# Patient Record
Sex: Male | Born: 1945 | Race: White | Hispanic: No | Marital: Married | Smoking: Former smoker
Health system: Southern US, Community
[De-identification: ages and names within clinical notes are randomized; demographics above are authoritative.]

## PROBLEM LIST (undated history)

## (undated) DIAGNOSIS — K449 Diaphragmatic hernia without obstruction or gangrene: Secondary | ICD-10-CM

## (undated) DIAGNOSIS — K219 Gastro-esophageal reflux disease without esophagitis: Secondary | ICD-10-CM

## (undated) DIAGNOSIS — J189 Pneumonia, unspecified organism: Secondary | ICD-10-CM

## (undated) DIAGNOSIS — K648 Other hemorrhoids: Secondary | ICD-10-CM

## (undated) DIAGNOSIS — E785 Hyperlipidemia, unspecified: Secondary | ICD-10-CM

## (undated) DIAGNOSIS — K5790 Diverticulosis of intestine, part unspecified, without perforation or abscess without bleeding: Secondary | ICD-10-CM

## (undated) DIAGNOSIS — J309 Allergic rhinitis, unspecified: Secondary | ICD-10-CM

## (undated) DIAGNOSIS — K635 Polyp of colon: Secondary | ICD-10-CM

## (undated) HISTORY — DX: Gastro-esophageal reflux disease without esophagitis: K21.9

## (undated) HISTORY — DX: Allergic rhinitis, unspecified: J30.9

## (undated) HISTORY — DX: Other hemorrhoids: K64.8

## (undated) HISTORY — DX: Hyperlipidemia, unspecified: E78.5

## (undated) HISTORY — PX: ANTERIOR CRUCIATE LIGAMENT REPAIR: SHX115

## (undated) HISTORY — DX: Diaphragmatic hernia without obstruction or gangrene: K44.9

## (undated) HISTORY — DX: Diverticulosis of intestine, part unspecified, without perforation or abscess without bleeding: K57.90

## (undated) HISTORY — DX: Pneumonia, unspecified organism: J18.9

## (undated) HISTORY — DX: Polyp of colon: K63.5

---

## 2000-03-18 ENCOUNTER — Encounter: Payer: Self-pay | Admitting: Gastroenterology

## 2000-03-18 DIAGNOSIS — K449 Diaphragmatic hernia without obstruction or gangrene: Secondary | ICD-10-CM | POA: Insufficient documentation

## 2000-03-18 DIAGNOSIS — K5730 Diverticulosis of large intestine without perforation or abscess without bleeding: Secondary | ICD-10-CM | POA: Insufficient documentation

## 2001-06-17 ENCOUNTER — Ambulatory Visit (HOSPITAL_BASED_OUTPATIENT_CLINIC_OR_DEPARTMENT_OTHER): Admission: RE | Admit: 2001-06-17 | Discharge: 2001-06-17 | Payer: Self-pay | Admitting: Specialist

## 2005-07-12 ENCOUNTER — Encounter: Admission: RE | Admit: 2005-07-12 | Discharge: 2005-07-12 | Payer: Self-pay | Admitting: Internal Medicine

## 2005-07-14 ENCOUNTER — Encounter: Admission: RE | Admit: 2005-07-14 | Discharge: 2005-07-14 | Payer: Self-pay | Admitting: Internal Medicine

## 2007-10-11 ENCOUNTER — Ambulatory Visit: Payer: Self-pay | Admitting: Gastroenterology

## 2008-01-18 DIAGNOSIS — J301 Allergic rhinitis due to pollen: Secondary | ICD-10-CM | POA: Insufficient documentation

## 2008-01-18 DIAGNOSIS — K219 Gastro-esophageal reflux disease without esophagitis: Secondary | ICD-10-CM | POA: Insufficient documentation

## 2009-01-02 ENCOUNTER — Ambulatory Visit: Payer: Self-pay | Admitting: Internal Medicine

## 2009-03-29 ENCOUNTER — Ambulatory Visit: Payer: Self-pay | Admitting: Internal Medicine

## 2009-05-29 ENCOUNTER — Ambulatory Visit: Payer: Self-pay | Admitting: Internal Medicine

## 2009-09-21 ENCOUNTER — Ambulatory Visit: Payer: Self-pay | Admitting: Internal Medicine

## 2011-04-15 NOTE — Assessment & Plan Note (Signed)
[facility]                         GASTROENTEROLOGY OFFICE NOTE   Richard, Smith                        MRN:          [id number]  DATE:10/11/2007                            DOB:          Feb 25, 1946    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 Lenord Fellers for evaluation of refractory reflux symptoms.   Patient has had at least a 30 year history of acid reflux, initially saw  myself In April of 2001 and underwent endoscopy and colonoscopy.  Both  of these exams were unremarkable although he did have a 5 sonometer  hiatal hernia noted.  The patient at that time having a history of  sensitivity to proton pump inhibitors was on twice a day H2 blocker  therapy and has not really been seen since that time.   He apparently has done well but has recently been on Nexium 40 mg a day  with p.r.n. Ranitidine use.  He denies burning substernal chest pain but  has constant belching, burping and regurgitation after eating with a  fullness in his subxiphoid area.  He has had no anorexia, weight loss or  true dysphagia or true odynophagia.  He denies any hepatobiliary  complaints.  He is having regular bowel movements without melena or  hematochezia.  He has no anorexia, weight loss or food intolerances.   PAST MEDICAL HISTORY:  Patient has allergic sinusitis but denies other  medical problems.   MEDICATIONS:  1. Nexium 40 mg a day.  2. Ranitidine 150 mg in the morning.  3. P.r.n. antacids.   HE DENIES DRUG ALLERGIES.   FAMILY HISTORY:  Noncontributory.   SOCIAL HISTORY:  Married, lives with his wife.  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  Does not smoke and uses 1 cocktail a day.  He denies  problems with alcohol dependency.   REVIEW OF SYSTEMS:  Noncontributory, without any symptoms of Raynaud's  phenomenon or other symptoms of collagen vascular disease.  He denies  any current cardiovascular, or  pulmonary complaints.  It is of note that  the patient had cardiac evaluation including stress Cardiolite testing  in March of 2005 which was normal.  Review of systems otherwise  negative.   EXAMINATION:  He is a healthy-appearing white male in no distress,  appearing his stated age.  He is 6 feet 1 inches tall, weighs 215  pounds, blood pressure is 122/80 and pulse was 68 and regular.  I could  not appreciate stigmata of chronic liver disease or thyromegaly.  CHEST:  Clear and he was in a regular rhythm without murmurs, gallops or  rubs.  I could not appreciate hepatosplenomegaly, abdominal masses or  tenderness.  Bowel sounds were normal.  Peripheral extremities were  unremarkable and mental status was clear.   ASSESSMENT:  Richard Smith has a minor sized hiatal hernia and has chronic  regurgitation despite regular proton pump inhibitor therapy.  I suspect  he may be a good candidate for fundoplication therapy depending on the  refractory nature of his symptoms.   RECOMMENDATIONS:  1. Repeat endoscopic exam.  2. Interval endoscopy, will tentatively schedule monometry exam.  3. Increase Nexium to 40 mg twice a day 30 minutes before meals and we      will try Reglan 10 mg at bedtime.  4. Standard anti-reflux regimen and precautions.  5. Continue medical followup as otherwise outlined with Dr. Lenord Fellers.     Vania Rea. Jarold Motto, MD, Caleen Essex, FAGA  Electronically Signed    DRP/MedQ  DD: 10/11/2007  DT: 10/12/2007  Job #: [id number]

## 2011-04-18 NOTE — Op Note (Signed)
[facility]  Patient:    LENY, MOROZOV                      MRN: [id number] Proc. Date: 06/17/01 Adm. Date:  [id number] Disc. Date: [id number] Attending:  Erasmo Leventhal                           Operative Report  PREOPERATIVE DIAGNOSES:  Right knee torn anterior cruciate ligament with probable torn meniscus.  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  PROCEDURES:  Right knee examination under anesthesia, arthroscopic-assisted endoscopic allograft ACL reconstruction, partial medial meniscectomy, chrondroplasty of medial femoral condyle.  SURGEON:  R. Valma Cava, M.D.  ASSISTANT:  French Ana Shuford, P.A.-C.  ANESTHESIA:  Preoperative femoral nerve and knee block; general anesthetic.  ESTIMATED BLOOD LOSS:  Less than 10 cc.  TOURNIQUET TIME:  1 hour and 20 minutes at 350 mmHg.  COMPLICATIONS:  None.  DISPOSITION:  To PACU stable.  OPERATIVE DETAILS:  The patient was counseled in the holding area, correct side was identified.  An IV was started, antibiotics were given, and a block was administered. Patient was taken to the OR and placed in the supine position under general anesthesia.  The right knee was examined.  Extension lacked 5 degrees, flexion 130 degrees, 2+ Lachmans, soft end point, 2+ anterior drawer, soft end point.  Collaterals and PCL were intact.   The leg was  elevated and prepped with DuraPrep and draped in a sterile fashion. We chose an age and size-appropriate allograft and contoured it on the back table for bone-tendon-bone patellar tendon allograft.  The patient had been previously counseled about this before surgery. The right lower extremity was elevated, exsanguinated with an Esmarch and the tourniquet was inflated to 350 mmHg.  A standard three port arthroscopy was  performed through a proximal, medial, anteromedial, and anterolateral portal.  Diagnostic arthroscopy was undertaken. The patellofemoral joint was unremarkable.  On looking at the intercondylar notch, he had a complete disruption of the ACL with no remaining fibers intact. The PCL was intact.  There was some notch hypertrophy.  There was also a displaced bucket-handle tear of the medial meniscus which was irreparable.  The motorized shaver was then introduced to debride the ACL fibers.  Also, the anterior horn of the medial meniscus was transected and the remainder of the meniscus removed with baskets and the motorized shaver.  The meniscal rim was stable.  Grade 2 chondromalacia of the medial femoral condyle was noted and a chondroplasty was done with the mechanical shaver back to stable base.  All other compartments were inspected. The articular cartilage was healthy as was the lateral meniscus.  An anterior medial incision made just for the tibial tunnel insertion and a periosteal window was made above the pes tendon insertion.  A guidepin was directly placed at the ACL footprint and over-reamed with a reamer.  The femoral guide was put in the correct position, and the guide pin was engaged and arthroscopically drilled the femoral tunnel to the appropriate depth. Both tunnels were debrided as was the arthroscopic debris from the knee.  The knee was then flexed to 90 degrees. A two pin patch was placed.  The graft was delivered through the knee and securely affixed to the femoral tunnel and locked with the Linvatec bioscrew. The knee  was then put through a range of motion.  There was no notch impingement.  The graft had excellent physiometric movement pattern.  After allowing the graft to creep for the next several minutes, it was securely affixed in the tibial tunnel with the Linvatec bioscrew.  The knee was put through a range of motion, range of motion was full.  The graft was  stable on both ends and had excellent tension and orientation.  The wound was irrigated and arthroscopic equipment was removed, and a drain was placed across the new cannula.  The periosteal wound was closed with Vicryls -- subcuticular Vicryl, skin closed with subcuticular Monocryl suture as were the portal sites.  Marcaine 0.25% 10 cc with epinephrine was injected into the wound and the incision site on the anteromedial tibia.  Sterile dressing was applied and the tourniquet was deflated with normal pulses in the foot and ankle at the end of the case. ACE wraps were applied and an ice pack and knee immobilizer in full extension.  He was also given another gram of Ancef intravenously.  The tourniquet was deflated.  He had been given Toradol 30 mg intravenously during the case. Sponge, needle, and instrument counts correct. There were no complications. He was then gently awakened.  He was taken from the operating room to PACU in stable condition. DD:  06/17/01 TD:  06/18/01 Job: [id number]

## 2011-06-12 ENCOUNTER — Ambulatory Visit: Payer: Self-pay | Admitting: Physical Medicine and Rehabilitation

## 2011-07-10 ENCOUNTER — Encounter: Payer: Self-pay | Admitting: Internal Medicine

## 2011-08-26 ENCOUNTER — Encounter: Payer: Self-pay | Admitting: Internal Medicine

## 2011-08-26 ENCOUNTER — Telehealth: Payer: Self-pay | Admitting: Internal Medicine

## 2011-08-26 ENCOUNTER — Ambulatory Visit (INDEPENDENT_AMBULATORY_CARE_PROVIDER_SITE_OTHER): Payer: PRIVATE HEALTH INSURANCE | Admitting: Internal Medicine

## 2011-08-26 VITALS — BP 151/92 | HR 79 | Temp 97.9°F | Resp 16 | Ht 73.0 in | Wt 223.0 lb

## 2011-08-26 DIAGNOSIS — J069 Acute upper respiratory infection, unspecified: Secondary | ICD-10-CM

## 2011-08-26 DIAGNOSIS — E559 Vitamin D deficiency, unspecified: Secondary | ICD-10-CM

## 2011-08-26 MED ORDER — AZITHROMYCIN 250 MG PO TABS: ORAL_TABLET | ORAL | Status: AC

## 2011-08-26 NOTE — Patient Instructions (Signed)
Upper Respiratory Infection (URI), Adult You have an upper respiratory infection (URI). This is also known as the common cold. The upper respiratory tract includes the nose, sinuses, throat, trachea, and bronchi (airways leading to the lungs).  CAUSE Many different viruses can infect the tissues lining the upper respiratory tract. The tissues become irritated and inflamed and often become very moist. A cold is contagious. You can easily spread the virus to others by oral contact. This includes kissing, sharing a glass, and coughing or sneezing. It can also be spread by touching your mouth or nose and then touching a surface which is then touched by another person. Symptoms typically develop one to three days after you come in contact with a cold virus. SYMPTOMS Symptoms vary from person to person. They may include runny nose, sneezing, nasal congestion, sinus irritation, sore throat, laryngitis, or cough. Fatigue, muscle aches, headache, and low grade fever may also occur. DIAGNOSIS You may diagnose your own upper respiratory infection based on familiar symptoms, since most people get a cold 2-3 times a year. Your caregiver can confirm this based on your examination. Most importantly, your caregiver can check that your symptoms are not due to another disease such as strep throat, sinusitis, pneumonia, asthma, epiglottis, or others. Blood tests, throat tests, and x-rays are not necessary to diagnose an upper respiratory infection.  PREVENTION The best way to protect against getting a cold is to practice good hygiene. Avoid oral or hand contact with people with cold symptoms. Wash hands often if contact occurs. There is no clear evidence that vitamin C, vitamin E, Echinacea, or exercise reduces the chance of developing an upper respiratory infection. PROGNOSIS An upper respiratory infection is benign. Most people improve within a week, but symptoms can last two weeks. A residual cough may last a week  longer. Complications can develop. RISKS AND COMPLICATIONS You may be at risk for a more severe case of the common cold if you smoke cigarettes, have chronic heart or lung disease, or if you have a weakened immune system. Bacterial sinusitis, middle ear infections, and bacterial pneumonia can complicate the common cold. The common cold can worsen asthma and COPD (chronic obstructive pulmonary disease). Sometimes these complications can be life threatening. TREATMENT Treatment is directed at relieving symptoms. There is no cure. Antibiotics are not effective. In fact, improper use of antibiotics can lead to the development of drug resistant bacteria. Non-medical treatment is safe and may be helpful:  Increased fluid intake.   Inhale heated mist or steam (vaporizer or shower).   Sip chicken soup.   Get plenty of rest.   Use gargles or lozenges for comfort.  Zinc gel and zinc lozenges, taken in the first 24 hours of the common cold, can shorten the duration and lessen the severity of symptoms. Pain medications may help fever, muscle aches, and throat pain. A variety of non-prescription medications are available to treat congestion and runny nose. Your caregiver can make recommendations and may suggest nasal or lung inhalers for other symptoms. Once again, antibiotics are not effective for upper respiratory infections.  HOME CARE INSTRUCTIONS  Only take over-the-counter or prescription medicines for pain, discomfort, or fever as directed by your caregiver.   Use a warm mist humidifier or inhale steam from a shower to increase air moisture. This may keep secretions moist and make it easier to breathe.   Drink plenty of clear liquids. You are drinking enough fluids if the urine remains a light yellow color rather than a  dark color.   Rest as needed.   Return to work when your temperature has returned to normal or as your caregiver advises. You may need to stay home longer to avoid infecting  others. You can also use a face mask and careful hand washing to prevent spread of virus.  SEEK MEDICAL CARE IF:  After the first few days, you feel you are getting worse rather than better.   You need your caregiver's advice about medications to control symptoms.   You develop symptoms that you think suggest complications such as pneumonia, sinusitis, or strep throat.  SEEK IMMEDIATE MEDICAL CARE IF:  You develop an oral temperature above 101.20F or if the fever lasts more than 2 days.   You develop severe or persistent headache, ear pain, sinus pain, or chest pain.   You develop a prolonged cough, cough up blood, have a change in your usual mucus (if you have chronic lung disease), or develop wheezing.   You develop sore muscles, stiff neck, or severe headache not controlled with medications.  Document Released: 05/13/2001 Document Re-Released: 08/26/2008 [facility] Patient Information 2011 [place], [place].

## 2011-08-26 NOTE — Telephone Encounter (Signed)
Patient came in for appt.

## 2011-08-26 NOTE — Telephone Encounter (Signed)
Fine for him to come at 11:45am

## 2011-08-26 NOTE — Telephone Encounter (Signed)
Pt wants to be seen today he thinks he has strep throat

## 2011-08-26 NOTE — Progress Notes (Signed)
  Subjective:    Patient ID: Richard Smith, male    DOB: Apr 29, 1946, 65 y.o.   MRN: [id number]  Sore Throat  This is a new problem. The current episode started in the past 7 days. The problem has been gradually improving. Neither side of throat is experiencing more pain than the other. There has been no fever. The pain is moderate. Associated symptoms include congestion and coughing. Pertinent negatives include no diarrhea, drooling, ear pain, headaches, neck pain, shortness of breath, swollen glands or trouble swallowing. He has had no exposure to strep. He has tried NSAIDs for the symptoms. The treatment provided mild relief.    Outpatient Encounter Prescriptions as of 08/26/2011  Medication Sig Dispense Refill  . esomeprazole (NEXIUM) 20 MG capsule Take 20 mg by mouth daily before breakfast.        . Loratadine (CLARITIN) 10 MG CAPS Take 1 capsule by mouth daily.           Review of Systems  HENT: Positive for congestion. Negative for ear pain, drooling, trouble swallowing and neck pain.   Respiratory: Positive for cough. Negative for shortness of breath.   Gastrointestinal: Negative for diarrhea.  Neurological: Negative for headaches.   BP 151/92  Pulse 79  Temp(Src) 97.9 F (36.6 C) (Oral)  Resp 16  Ht 6\' 1"  (1.854 m)  Wt 223 lb (101.152 kg)  BMI 29.42 kg/m2  SpO2 97%     Objective:   Physical Exam  Constitutional: He is oriented to person, place, and time. He appears well-developed and well-nourished. No distress.  HENT:  Head: Normocephalic and atraumatic.  Right Ear: Tympanic membrane, external ear and ear canal normal.  Left Ear: Tympanic membrane, external ear and ear canal normal.  Nose: Nose normal.  Mouth/Throat: Oropharynx is clear and moist. No oropharyngeal exudate, posterior oropharyngeal edema, posterior oropharyngeal erythema or tonsillar abscesses.  Eyes: Conjunctivae and EOM are normal. Pupils are equal, round, and reactive to light. Right eye exhibits no  discharge. Left eye exhibits no discharge. No scleral icterus.  Neck: Normal range of motion. Neck supple. No tracheal deviation present. No thyromegaly present.  Cardiovascular: Normal rate, regular rhythm and normal heart sounds.  Exam reveals no gallop and no friction rub.   No murmur heard. Pulmonary/Chest: Effort normal and breath sounds normal. No respiratory distress. He has no wheezes. He has no rales. He exhibits no tenderness.  Musculoskeletal: Normal range of motion. He exhibits no edema.  Lymphadenopathy:    He has no cervical adenopathy.  Neurological: He is alert and oriented to person, place, and time. No cranial nerve deficit. Coordination normal.  Skin: Skin is warm and dry. No rash noted. He is not diaphoretic. No erythema. No pallor.  Psychiatric: He has a normal mood and affect. His behavior is normal. Judgment and thought content normal.          Assessment & Plan:  1. Upper respiratory infection - Pt with likely viral URI.  Few crackles noted on right lung exam, which cleared with cough. He has h/o bronchitis, so will give Rx for azithromycin to start if symptoms of cough worsen. Otherwise, supportive care including OTC ibuprofen, sudafed, mucinex. Rest, increased fluids. He will call or RTC if symptoms worsen.

## 2011-10-16 ENCOUNTER — Other Ambulatory Visit: Payer: Self-pay | Admitting: Internal Medicine

## 2011-11-07 ENCOUNTER — Other Ambulatory Visit: Payer: Self-pay | Admitting: *Deleted

## 2011-11-07 MED ORDER — CLOTRIMAZOLE-BETAMETHASONE 1-0.05 % EX CREA: TOPICAL_CREAM | CUTANEOUS | Status: DC

## 2011-11-07 NOTE — Progress Notes (Signed)
Pharm faxed RF request -  It is pending, if ok please sign orders

## 2011-11-07 NOTE — Progress Notes (Signed)
OK to fill

## 2011-11-17 ENCOUNTER — Other Ambulatory Visit: Payer: Self-pay | Admitting: *Deleted

## 2011-11-17 MED ORDER — METOCLOPRAMIDE HCL 5 MG/5ML PO SOLN: 10.0000 mg | Freq: Three times a day (TID) | ORAL | Status: DC

## 2011-11-18 ENCOUNTER — Other Ambulatory Visit: Payer: Self-pay | Admitting: *Deleted

## 2011-11-18 MED ORDER — METOCLOPRAMIDE HCL 10 MG PO TABS: 10.0000 mg | ORAL_TABLET | Freq: Three times a day (TID) | ORAL | Status: DC | PRN

## 2012-01-13 ENCOUNTER — Telehealth: Payer: Self-pay | Admitting: *Deleted

## 2012-01-13 NOTE — Telephone Encounter (Signed)
Triage Record Num: [id number] Operator: Revonda Humphrey Patient Name: Richard Smith Call Date & Time: 01/13/2012 4:12:39PM Patient Phone: [phone] PCP: Ronna Polio Patient Gender: Male PCP Fax : [phone] Patient DOB: 11-Aug-1946 Practice Name: [facility] Reason for Call: Caller: [name]/Patient; PCP: Ronna Polio; CB#: [phone]; ; ; Call regarding Cough/Congestion; Started sore throat yesterday 01/12/2012. Alternating feeling cold, shaking and feeling hot. Cough with fever to 101 by evening. Vomited once yesterday and twice today. Mild headache, nausea so taking ice chips. Coughing up green mucus with slight blood. Temp currently 99. Congestion. Gave care information for nausea, cough. Appt 9:45am Dr Dan Humphreys tomorrow Protocol(s) Used: Flu-Like Symptoms Recommended Outcome per Protocol: See Provider within 24 hours Reason for Outcome: Productive cough with colored sputum (other than clear or white sputum) Care Advice: ~ Use a cool mist humidifier to moisten air. Be sure to clean according to manufacturer's instructions. ~ Rest until symptoms improve. Increase fluids to 8-12 eight oz (1.6 to 2.4 liters) glasses per day, half of them to be water. Soups, popsicles, fruit juices, non-caffeinated sodas (unless restricting sodium intake), jello, broths, decaf teas, etc. are all okay. Warm fluids can be soothing. ~ 01/13/2012 5:00:51PM Page 1 of 1 [email or web address]

## 2012-01-14 ENCOUNTER — Encounter: Payer: Self-pay | Admitting: Internal Medicine

## 2012-01-14 ENCOUNTER — Ambulatory Visit (INDEPENDENT_AMBULATORY_CARE_PROVIDER_SITE_OTHER): Payer: PRIVATE HEALTH INSURANCE | Admitting: Internal Medicine

## 2012-01-14 DIAGNOSIS — R112 Nausea with vomiting, unspecified: Secondary | ICD-10-CM

## 2012-01-14 DIAGNOSIS — J111 Influenza due to unidentified influenza virus with other respiratory manifestations: Secondary | ICD-10-CM

## 2012-01-14 LAB — POCT INFLUENZA A/B: Influenza B, POC: POSITIVE

## 2012-01-14 MED ORDER — OSELTAMIVIR PHOSPHATE 75 MG PO CAPS: 75.0000 mg | ORAL_CAPSULE | Freq: Two times a day (BID) | ORAL | Status: AC

## 2012-01-14 MED ORDER — ONDANSETRON 8 MG PO TBDP: 8.0000 mg | ORAL_TABLET | Freq: Three times a day (TID) | ORAL | Status: AC | PRN

## 2012-01-14 NOTE — Assessment & Plan Note (Signed)
Symptoms consistent with influenza. Rapid influenza test is positive. Given that symptoms have been present for less than 48 hours, will try a course of Tamiflu to help shorten the course of illness. Patient's wife will also be treated with prophylactic dose of Tamiflu. Encouraged rest, increase fluid intake. Patient will use Tylenol or ibuprofen as needed for fever and chills. He will use Zofran as needed for nausea. He will call or e-mail on Friday with update. He will call sooner if symptoms are worsening or if he develops recurrent fever.

## 2012-01-14 NOTE — Telephone Encounter (Signed)
Patient was seen in office today.  

## 2012-01-14 NOTE — Progress Notes (Signed)
Subjective:    Patient ID: Richard Smith, male    DOB: 01/25/1946, 66 y.o.   MRN: [id number]  HPI 66 year old male presents for acute visit complaining of fever, chills, malaise, nausea, vomiting, nasal congestion, and cough over the last 2 days. He reports that his symptoms first began on Monday morning. Symptoms started with nausea and vomiting. He then developed some fever and chills with headache. He reports that his emesis consisted of food products. He denies any blood in his emesis. He denies any diarrhea, abdominal pain. He reports malaise and fatigue. He has not been able to tolerate any food. He has been sipping on water with ice chips. Today, he reports his nausea is improved. However, he continues to feel fatigued and has nasal congestion and cough productive of yellow sputum. He denies shortness of breath or chest pain. He notes that numerous coworkers are out sick with similar symptoms.  Outpatient Encounter Prescriptions as of 01/14/2012  Medication Sig Dispense Refill  . clotrimazole-betamethasone (LOTRISONE) cream Apply to affected area 2 times daily  15 g  3  . esomeprazole (NEXIUM) 20 MG capsule Take 20 mg by mouth daily before breakfast.        . Loratadine (CLARITIN) 10 MG CAPS Take 1 capsule by mouth daily.        . Multiple Vitamins-Minerals (MULTIVITAMIN WITH MINERALS) tablet Take 1 tablet by mouth daily.      . ondansetron (ZOFRAN-ODT) 8 MG disintegrating tablet Take 1 tablet (8 mg total) by mouth every 8 (eight) hours as needed for nausea.  20 tablet  0  . oseltamivir (TAMIFLU) 75 MG capsule Take 1 capsule (75 mg total) by mouth 2 (two) times daily.  10 capsule  0  . DISCONTD: Vitamin D, Ergocalciferol, (DRISDOL) 50000 UNITS CAPS TAKE 1 CAPSULE BY MOUTH ONCE A WEEK  12 capsule  0    Review of Systems  Constitutional: Positive for fever and chills. Negative for activity change and fatigue.  HENT: Positive for congestion and rhinorrhea. Negative for hearing loss, ear pain,  nosebleeds, sore throat, sneezing, trouble swallowing, neck pain, neck stiffness, voice change, postnasal drip, sinus pressure, tinnitus and ear discharge.   Eyes: Negative for discharge, redness, itching and visual disturbance.  Respiratory: Positive for cough. Negative for chest tightness, shortness of breath, wheezing and stridor.   Cardiovascular: Negative for chest pain and leg swelling.  Gastrointestinal: Positive for nausea and vomiting.  Musculoskeletal: Negative for myalgias and arthralgias.  Skin: Negative for color change and rash.  Neurological: Negative for dizziness, facial asymmetry and headaches.  Psychiatric/Behavioral: Negative for sleep disturbance.   BP 128/78  Pulse 88  Temp(Src) 97.4 F (36.3 C) (Oral)  Ht 6\' 1"  (1.854 m)  Wt 215 lb (97.523 kg)  BMI 28.37 kg/m2  SpO2 97%     Objective:   Physical Exam  Constitutional: He is oriented to person, place, and time. He appears well-developed and well-nourished. No distress.  HENT:  Head: Normocephalic and atraumatic.  Right Ear: External ear normal.  Left Ear: External ear normal.  Nose: Nose normal.  Mouth/Throat: Oropharynx is clear and moist. No oropharyngeal exudate.  Eyes: Conjunctivae and EOM are normal. Pupils are equal, round, and reactive to light. Right eye exhibits no discharge. Left eye exhibits no discharge. No scleral icterus.  Neck: Normal range of motion. Neck supple. No tracheal deviation present. No thyromegaly present.  Cardiovascular: Normal rate, regular rhythm and normal heart sounds.  Exam reveals no gallop and no friction rub.  No murmur heard. Pulmonary/Chest: Effort normal and breath sounds normal. No respiratory distress. He has no wheezes. He has no rales. He exhibits no tenderness.  Musculoskeletal: Normal range of motion. He exhibits no edema.  Lymphadenopathy:    He has no cervical adenopathy.  Neurological: He is alert and oriented to person, place, and time. No cranial nerve  deficit. Coordination normal.  Skin: Skin is warm and dry. No rash noted. He is not diaphoretic. No erythema. No pallor.  Psychiatric: He has a normal mood and affect. His behavior is normal. Judgment and thought content normal.          Assessment & Plan:

## 2012-01-16 ENCOUNTER — Encounter: Payer: Self-pay | Admitting: Internal Medicine

## 2012-03-25 ENCOUNTER — Other Ambulatory Visit: Payer: Self-pay | Admitting: Internal Medicine

## 2012-03-25 NOTE — Telephone Encounter (Signed)
We should check Vit D level prior to restarting.

## 2012-04-29 ENCOUNTER — Telehealth: Payer: Self-pay | Admitting: Internal Medicine

## 2012-04-29 NOTE — Telephone Encounter (Signed)
Caller: Richard Smith/Patient; PCP: Ronna Polio; CB#: [phone];  Call regarding intermittant Chest Pain/Chest Discomfort for past month onset ~03/30/12. Pain is an achying and lasts only 2-3 min/ Occurs 2-3x /week. He is very active and pain does not occur with activities; He takes Nexium daily -taking as directed. Decreased energy level for past 6 months. Triage and Care advice per Chest Pain Protocol and advised to be checked within 72 hours for "pains are stabbing come and go and last only a few seconds and not previously evaluated". Appnt scheduled with Dr. Dan Humphreys for 04/30/12 at 0900.

## 2012-04-30 ENCOUNTER — Encounter: Payer: Self-pay | Admitting: Internal Medicine

## 2012-04-30 ENCOUNTER — Ambulatory Visit (INDEPENDENT_AMBULATORY_CARE_PROVIDER_SITE_OTHER): Payer: PRIVATE HEALTH INSURANCE | Admitting: Internal Medicine

## 2012-04-30 VITALS — BP 130/81 | HR 69 | Temp 97.5°F | Resp 16 | Wt 216.5 lb

## 2012-04-30 DIAGNOSIS — E559 Vitamin D deficiency, unspecified: Secondary | ICD-10-CM

## 2012-04-30 DIAGNOSIS — R079 Chest pain, unspecified: Secondary | ICD-10-CM

## 2012-04-30 LAB — COMPREHENSIVE METABOLIC PANEL
AST: 28 U/L (ref 0–37)
CO2: 31 mEq/L (ref 19–32)
Calcium: 9.3 mg/dL (ref 8.4–10.5)
Chloride: 105 mEq/L (ref 96–112)
Potassium: 4.1 mEq/L (ref 3.5–5.1)
Sodium: 142 mEq/L (ref 135–145)
Total Protein: 7 g/dL (ref 6.0–8.3)

## 2012-04-30 LAB — CBC WITH DIFFERENTIAL/PLATELET
Basophils Absolute: 0.1 10*3/uL (ref 0.0–0.1)
Basophils Relative: 0.9 % (ref 0.0–3.0)
HCT: 48.2 % (ref 39.0–52.0)
Lymphs Abs: 2.8 10*3/uL (ref 0.7–4.0)
Neutrophils Relative %: 47.8 % (ref 43.0–77.0)
Platelets: 246 10*3/uL (ref 150.0–400.0)
RBC: 5.32 Mil/uL (ref 4.22–5.81)
WBC: 7.8 10*3/uL (ref 4.5–10.5)

## 2012-04-30 LAB — LIPID PANEL
Cholesterol: 205 mg/dL — ABNORMAL HIGH (ref 0–200)
HDL: 38 mg/dL — ABNORMAL LOW (ref >39.00)
Total CHOL/HDL Ratio: 5
Triglycerides: 241 mg/dL — ABNORMAL HIGH (ref 0.0–149.0)

## 2012-04-30 LAB — LDL CHOLESTEROL, DIRECT: Direct LDL: 136.5 mg/dL

## 2012-04-30 NOTE — Assessment & Plan Note (Signed)
Patient history of vitamin D deficiency. He has been taking 25,000 units of vitamin D over-the-counter. Will recheck level of vitamin D daily.

## 2012-04-30 NOTE — Progress Notes (Signed)
Subjective:    Patient ID: Richard Smith, male    DOB: 1946/11/23, 66 y.o.   MRN: [id number]  HPI 66 year old male presents for acute visit complaining of 2 month history of intermittent episodes of left-sided chest pain. The pain is described as aching or tightness in the left chest. It occurs at rest. It lasts for 2-3 minutes each time, occurring 2-3 times per week. It resolves without any intervention. It does not radiate. It is not associated with nausea, diaphoresis, palpitations. It does not occur during times of exertion. 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 However, he does note some progressive fatigue over the last couple of months. He has not had any fever, chills, change in bowel habits, or other symptoms.   Outpatient Encounter Prescriptions as of 04/30/2012  Medication Sig Dispense Refill  . clotrimazole-betamethasone (LOTRISONE) cream Apply to affected area 2 times daily  15 g  3  . esomeprazole (NEXIUM) 20 MG capsule Take 20 mg by mouth daily before breakfast.        . Loratadine (CLARITIN) 10 MG CAPS Take 1 capsule by mouth daily.        . Multiple Vitamins-Minerals (MULTIVITAMIN WITH MINERALS) tablet Take 1 tablet by mouth daily.      . Vitamin D, Ergocalciferol, (DRISDOL) 50000 UNITS CAPS TAKE 1 CAPSULE BY MOUTH ONCE A WEEK  12 capsule  0    Review of Systems  Constitutional: Positive for fatigue. Negative for fever, chills, activity change, appetite change and unexpected weight change.  Eyes: Negative for visual disturbance.  Respiratory: Negative for cough, shortness of breath and wheezing.   Cardiovascular: Positive for chest pain. Negative for palpitations and leg swelling.  Gastrointestinal: Negative for nausea, abdominal pain, diarrhea, constipation, blood in stool, abdominal distention and anal bleeding.  Genitourinary: Negative for dysuria, urgency and difficulty urinating.  Musculoskeletal:  Negative for arthralgias and gait problem.  Skin: Negative for color change and rash.  Neurological: Negative for light-headedness.  Hematological: Negative for adenopathy.  Psychiatric/Behavioral: Negative for sleep disturbance and dysphoric mood. The patient is not nervous/anxious.    BP 130/81  Pulse 69  Temp(Src) 97.5 F (36.4 C) (Oral)  Resp 16  Wt 216 lb 8 oz (98.204 kg)  SpO2 97%     Objective:   Physical Exam  Constitutional: He is oriented to person, place, and time. He appears well-developed and well-nourished. No distress.  HENT:  Head: Normocephalic and atraumatic.  Right Ear: External ear normal.  Left Ear: External ear normal.  Nose: Nose normal.  Mouth/Throat: Oropharynx is clear and moist. No oropharyngeal exudate.  Eyes: Conjunctivae and EOM are normal. Pupils are equal, round, and reactive to light. Right eye exhibits no discharge. Left eye exhibits no discharge. No scleral icterus.  Neck: Normal range of motion. Neck supple. No tracheal deviation present. No thyromegaly present.  Cardiovascular: Normal rate, regular rhythm and normal heart sounds.  Exam reveals no gallop and no friction rub.   No murmur heard. Pulmonary/Chest: Effort normal and breath sounds normal. No respiratory distress. He has no wheezes. He has no rales. He exhibits no tenderness.  Musculoskeletal: Normal range of motion. He exhibits no edema.  Lymphadenopathy:    He has no cervical adenopathy.  Neurological: He is alert and oriented to person, place, and time. No cranial nerve deficit. Coordination normal.  Skin: Skin is warm and dry. No rash noted. He is not diaphoretic. No erythema.  No pallor.  Psychiatric: He has a normal mood and affect. His behavior is normal. Judgment and thought content normal.          Assessment & Plan:

## 2012-04-30 NOTE — Assessment & Plan Note (Addendum)
Symptoms are atypical for myocardial ischemia given that they occur only at rest and not with exertion. He reports treadmill stress test in the past which was normal. However, he does have family history of heart disease. EKG was normal today. Will set up a cardiology evaluation. Question if he would benefit from repeat stress test. We'll also check lab work including CBC, CMP, TSH. If cardiac testing unrevealing, would favor further GI evaluation given patient's history of GERD.

## 2012-05-01 LAB — VITAMIN D 25 HYDROXY (VIT D DEFICIENCY, FRACTURES): Vit D, 25-Hydroxy: 47 ng/mL (ref 30–89)

## 2012-05-01 LAB — TROPONIN I: Troponin I: <0.01 ng/mL (ref <0.06)

## 2012-05-10 ENCOUNTER — Ambulatory Visit (INDEPENDENT_AMBULATORY_CARE_PROVIDER_SITE_OTHER): Payer: PRIVATE HEALTH INSURANCE | Admitting: Cardiovascular Disease

## 2012-05-10 ENCOUNTER — Encounter: Payer: Self-pay | Admitting: Cardiovascular Disease

## 2012-05-10 VITALS — BP 132/82 | HR 68 | Ht 73.0 in | Wt 220.0 lb

## 2012-05-10 DIAGNOSIS — R079 Chest pain, unspecified: Secondary | ICD-10-CM

## 2012-05-10 DIAGNOSIS — E785 Hyperlipidemia, unspecified: Secondary | ICD-10-CM

## 2012-05-10 NOTE — Progress Notes (Signed)
HPI  66 year old male who was referred by Dr. Dan Humphreys for evaluation of chest pain. This started about 2-3 months ago and has been intermittent. The pain is described as aching or tightness in the left chest. It occurs at rest. It lasts for 2-3 minutes each time, occurring 2-3 times per week. It happens at rest and not with physical activities. It resolves without any intervention. It does not radiate. It is moderate in intensity. It is not associated with nausea, diaphoresis, palpitations. It does not occur during times of exertion. 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 He also plays tennis . However, he does note some progressive fatigue over the last couple of months. He has not had any fever, chills, change in bowel habits, or other symptoms He has prolonged history of GERD but that has been controlled with Nexium. He has no previous cardiac history. He had a stress test done more than 8 years ago. He does not smoke and does not have family history of premature coronary artery disease. He has no history of diabetes or hypertension. He does have hyperlipidemia not on medications.  No Known Allergies   Current Outpatient Prescriptions on File Prior to Visit  Medication Sig Dispense Refill  . clotrimazole-betamethasone (LOTRISONE) cream Apply to affected area 2 times daily  15 g  3  . Loratadine (CLARITIN) 10 MG CAPS Take 1 capsule by mouth daily.        . Multiple Vitamins-Minerals (MULTIVITAMIN WITH MINERALS) tablet Take 1 tablet by mouth daily.      . Vitamin D, Ergocalciferol, (DRISDOL) 50000 UNITS CAPS TAKE 1 CAPSULE BY MOUTH ONCE A WEEK  12 capsule  0     Past Medical History  Diagnosis Date  . GERD (gastroesophageal reflux disease)   . Hyperlipidemia      Past Surgical History  Procedure Date  . Anterior cruciate ligament repair      Family History  Problem Relation Age of Onset  . Heart  disease Mother      History   Social History  . Marital Status: Married    Spouse Name: N/A    Number of Children: N/A  . Years of Education: N/A   Occupational History  . Not on file.   Social History Main Topics  . Smoking status: Former Smoker -- 1.0 packs/day for 15 years    Types: Cigarettes    Quit date: 08/25/1981  . Smokeless tobacco: Never Used  . Alcohol Use: Yes     daily  . Drug Use: No  . Sexually Active: Not on file   Other Topics Concern  . Not on file   Social History Narrative  . No narrative on file     ROS Constitutional: Negative for fever, chills, diaphoresis, activity change, appetite change and fatigue.  HENT: Negative for hearing loss, nosebleeds, congestion, sore throat, facial swelling, drooling, trouble swallowing, neck pain, voice change, sinus pressure and tinnitus.  Eyes: Negative for photophobia, pain, discharge and visual disturbance.  Respiratory: Negative for apnea, cough and wheezing.  Cardiovascular: Negative for  palpitations and leg swelling.  Gastrointestinal: Negative for nausea, vomiting, abdominal pain, diarrhea, constipation, blood in stool and abdominal distention.  Genitourinary: Negative for dysuria, urgency, frequency, hematuria and decreased urine volume.  Musculoskeletal: Negative for myalgias, back pain, joint swelling, arthralgias and gait problem.  Skin: Negative for color change, pallor, rash and wound.  Neurological:  Negative for dizziness, tremors, seizures, syncope, speech difficulty, weakness, light-headedness, numbness and headaches.  Psychiatric/Behavioral: Negative for suicidal ideas, hallucinations, behavioral problems and agitation. The patient is not nervous/anxious.     PHYSICAL EXAM   BP 132/82  Pulse 68  Ht 6\' 1"  (1.854 m)  Wt 220 lb (99.791 kg)  BMI 29.03 kg/m2  Constitutional: He is oriented to person, place, and time. He appears well-developed and well-nourished. No distress.  HENT: No nasal  discharge.  Head: Normocephalic and atraumatic.  Eyes: Pupils are equal and round. Right eye exhibits no discharge. Left eye exhibits no discharge.  Neck: Normal range of motion. Neck supple. No JVD present. No thyromegaly present.  Cardiovascular: Normal rate, regular rhythm, normal heart sounds and. Exam reveals no gallop and no friction rub. No murmur heard.  Pulmonary/Chest: Effort normal and breath sounds normal. No stridor. No respiratory distress. He has no wheezes. He has no rales. He exhibits no tenderness.  Abdominal: Soft. Bowel sounds are normal. He exhibits no distension. There is no tenderness. There is no rebound and no guarding.  Musculoskeletal: Normal range of motion. He exhibits no edema and no tenderness.  Neurological: He is alert and oriented to person, place, and time. Coordination normal.  Skin: Skin is warm and dry. No rash noted. He is not diaphoretic. No erythema. No pallor.  Psychiatric: He has a normal mood and affect. His behavior is normal. Judgment and thought content normal.      EKG: Sinus  Rhythm  -Right sided conduction defect and right axis -possible right ventricular hypertrophy or posterior fascicular block. Borderline IVCD.    ASSESSMENT AND PLAN

## 2012-05-10 NOTE — Patient Instructions (Signed)
Your physician has requested that you have en exercise stress myoview. For further information please visit [email or web address] Please follow instruction sheet, as given.  Follow up a needed.

## 2012-05-10 NOTE — Assessment & Plan Note (Signed)
His chest pain is overall atypical and does not sound anginal in nature. However, it has persisted for a few months. His baseline ECG is slightly abnormal but no clear signs of ischemia. His risk factors include age and hyperlipidemia. I recommend further evaluation with a treadmill Myoview stress test. His baseline ECG would make interpretation of a treadmill stress test very difficult. I discussed with the patient the importance of lifestyle changes in order to decrease the chance of future coronary artery disease and cardiovascular events. We discussed the importance of controlling risk factors, healthy diet as well as regular exercise. I also explained to him that a normal stress test does not rule out atherosclerosis.

## 2012-05-10 NOTE — Assessment & Plan Note (Signed)
Lab Results  Component Value Date   CHOL 205* 04/30/2012   HDL 38.00* 04/30/2012   LDLDIRECT 136.5 04/30/2012   TRIG 241.0* 04/30/2012   CHOLHDL 5 04/30/2012   He has mildly elevated LDL, low HDL and moderately elevated triglyceride. I discussed with him the importance of diet and exercise. I also advised him to take fish oil 3 g daily. If his stress test does not show significant ischemia as expected, lifestyle changes and he attempted first before considering adding a medication.

## 2012-05-12 ENCOUNTER — Ambulatory Visit: Payer: PRIVATE HEALTH INSURANCE | Admitting: Internal Medicine

## 2012-05-18 ENCOUNTER — Other Ambulatory Visit: Payer: Self-pay | Admitting: Internal Medicine

## 2012-05-18 ENCOUNTER — Ambulatory Visit (INDEPENDENT_AMBULATORY_CARE_PROVIDER_SITE_OTHER): Payer: PRIVATE HEALTH INSURANCE | Admitting: Internal Medicine

## 2012-05-18 ENCOUNTER — Encounter: Payer: Self-pay | Admitting: Internal Medicine

## 2012-05-18 VITALS — BP 140/90 | HR 69 | Temp 98.0°F | Ht 73.0 in | Wt 220.5 lb

## 2012-05-18 DIAGNOSIS — J0100 Acute maxillary sinusitis, unspecified: Secondary | ICD-10-CM

## 2012-05-18 MED ORDER — PREDNISONE (PAK) 10 MG PO TABS: ORAL_TABLET | ORAL | Status: AC

## 2012-05-18 MED ORDER — AMOXICILLIN-POT CLAVULANATE 875-125 MG PO TABS: 1.0000 | ORAL_TABLET | Freq: Two times a day (BID) | ORAL | Status: AC

## 2012-05-18 NOTE — Assessment & Plan Note (Signed)
Symptoms and exam consistent with acute maxillary sinusitis. Will treat with augmentin and use prednisone if symptoms persistent over weekend (pt traveling to [facility]). Pt will also use ibuprofen as needed for pain and sudafed for congestion. Follow up if symptoms not improving over next 72hr.

## 2012-05-18 NOTE — Progress Notes (Signed)
Subjective:    Patient ID: Richard Smith, male    DOB: [birth year], 66 y.o.   MRN: [id number]  HPI 66 year old male presents for acute visit complaining of 3 days of sinus congestion, purulent nasal drainage, facial pain, headache. He denies any fever or chills. He has a history of recurrent sinus infections in the past. He denies any cough or shortness of breath. He notes that he will be traveling to [facility] tomorrow for his daughter's graduation. He has been using Claritin with no improvement.  Outpatient Encounter Prescriptions as of 05/18/2012  Medication Sig Dispense Refill  . clotrimazole-betamethasone (LOTRISONE) cream Apply to affected area 2 times daily  15 g  3  . Loratadine (CLARITIN) 10 MG CAPS Take 1 capsule by mouth daily.        . Multiple Vitamins-Minerals (MULTIVITAMIN WITH MINERALS) tablet Take 1 tablet by mouth daily.      . Vitamin D, Ergocalciferol, (DRISDOL) 50000 UNITS CAPS TAKE 1 CAPSULE BY MOUTH ONCE A WEEK  12 capsule  0  . DISCONTD: esomeprazole (NEXIUM) 40 MG capsule Take 40 mg by mouth daily before breakfast.      . amoxicillin-clavulanate (AUGMENTIN) 875-125 MG per tablet Take 1 tablet by mouth 2 (two) times daily.  20 tablet  0  . predniSONE (STERAPRED UNI-PAK) 10 MG tablet Take 60mg  day 1 then taper by 10mg  daily  21 tablet  0    Review of Systems  Constitutional: Negative for fever, chills, activity change and fatigue.  HENT: Positive for congestion, rhinorrhea and sinus pressure. Negative for hearing loss, ear pain, nosebleeds, sore throat, sneezing, trouble swallowing, neck pain, neck stiffness, voice change, postnasal drip, tinnitus and ear discharge.   Eyes: Negative for discharge, redness, itching and visual disturbance.  Respiratory: Negative for cough, chest tightness, shortness of breath, wheezing and stridor.   Cardiovascular: Negative for chest pain and leg swelling.  Musculoskeletal: Negative for myalgias and arthralgias.  Skin: Negative for color  change and rash.  Neurological: Positive for headaches. Negative for dizziness and facial asymmetry.  Psychiatric/Behavioral: Negative for disturbed wake/sleep cycle.   BP 140/90  Pulse 69  Temp 98 F (36.7 C) (Oral)  Ht 6\' 1"  (1.854 m)  Wt 220 lb 8 oz (100.018 kg)  BMI 29.09 kg/m2  SpO2 98%     Objective:   Physical Exam  Constitutional: He is oriented to person, place, and time. He appears well-developed and well-nourished. No distress.  HENT:  Head: Normocephalic and atraumatic.  Right Ear: External ear normal.  Left Ear: External ear normal.  Nose: Mucosal edema present. Right sinus exhibits maxillary sinus tenderness. Left sinus exhibits maxillary sinus tenderness.  Mouth/Throat: Oropharynx is clear and moist. No oropharyngeal exudate.  Eyes: Conjunctivae and EOM are normal. Pupils are equal, round, and reactive to light. Right eye exhibits no discharge. Left eye exhibits no discharge. No scleral icterus.  Neck: Normal range of motion. Neck supple. No tracheal deviation present. No thyromegaly present.  Cardiovascular: Normal rate, regular rhythm and normal heart sounds.  Exam reveals no gallop and no friction rub.   No murmur heard. Pulmonary/Chest: Effort normal and breath sounds normal. No respiratory distress. He has no wheezes. He has no rales. He exhibits no tenderness.  Musculoskeletal: Normal range of motion. He exhibits no edema.  Lymphadenopathy:    He has no cervical adenopathy.  Neurological: He is alert and oriented to person, place, and time. No cranial nerve deficit. Coordination normal.  Skin: Skin is warm and dry. No rash  noted. He is not diaphoretic. No erythema. No pallor.  Psychiatric: He has a normal mood and affect. His behavior is normal. Judgment and thought content normal.          Assessment & Plan:

## 2012-06-07 ENCOUNTER — Ambulatory Visit: Payer: Self-pay | Admitting: Cardiovascular Disease

## 2012-06-07 DIAGNOSIS — R079 Chest pain, unspecified: Secondary | ICD-10-CM

## 2012-06-08 ENCOUNTER — Other Ambulatory Visit: Payer: Self-pay | Admitting: Cardiovascular Disease

## 2012-06-08 DIAGNOSIS — R079 Chest pain, unspecified: Secondary | ICD-10-CM

## 2012-06-11 ENCOUNTER — Telehealth: Payer: Self-pay | Admitting: *Deleted

## 2012-06-11 NOTE — Telephone Encounter (Signed)
Pt aware of results stress test

## 2012-06-11 NOTE — Telephone Encounter (Signed)
LMTCB--NT 

## 2012-06-11 NOTE — Telephone Encounter (Signed)
F/u   Patient calling back to speak with Harriett Sine.

## 2012-08-04 ENCOUNTER — Encounter: Payer: Self-pay | Admitting: Internal Medicine

## 2012-08-04 MED ORDER — PREDNISONE (PAK) 10 MG PO TABS: ORAL_TABLET | ORAL | Status: AC

## 2012-08-04 MED ORDER — AZITHROMYCIN 250 MG PO TABS: ORAL_TABLET | ORAL | Status: AC

## 2012-08-05 ENCOUNTER — Ambulatory Visit: Payer: PRIVATE HEALTH INSURANCE | Admitting: Internal Medicine

## 2012-08-16 ENCOUNTER — Encounter: Payer: Self-pay | Admitting: Internal Medicine

## 2012-11-16 ENCOUNTER — Other Ambulatory Visit: Payer: Self-pay | Admitting: Internal Medicine

## 2012-12-19 ENCOUNTER — Other Ambulatory Visit: Payer: Self-pay | Admitting: Internal Medicine

## 2012-12-27 ENCOUNTER — Encounter: Payer: Self-pay | Admitting: Internal Medicine

## 2012-12-27 ENCOUNTER — Ambulatory Visit (INDEPENDENT_AMBULATORY_CARE_PROVIDER_SITE_OTHER): Payer: PRIVATE HEALTH INSURANCE | Admitting: Internal Medicine

## 2012-12-27 VITALS — BP 142/90 | HR 68 | Temp 98.0°F | Ht 73.0 in | Wt 221.0 lb

## 2012-12-27 DIAGNOSIS — J189 Pneumonia, unspecified organism: Secondary | ICD-10-CM

## 2012-12-27 MED ORDER — AMOXICILLIN-POT CLAVULANATE 875-125 MG PO TABS: 1.0000 | ORAL_TABLET | Freq: Two times a day (BID) | ORAL | Status: DC

## 2012-12-27 NOTE — Assessment & Plan Note (Signed)
Symptoms are most consistent with bronchitis versus early right lower lobe pneumonia. Will treat with augmentin. Pt will email with update in 24-48hr. If no improvement, or if symptoms worsening, then will get CXR for further evaluation.

## 2012-12-27 NOTE — Progress Notes (Signed)
  Subjective:    Patient ID: Richard Smith, male    DOB: 02/02/1946, 67 y.o.   MRN: [id number]  HPI 67YO male presents for acute visit c/o 1.5 week h/o cough, nasal congestion, sinus pressure, dyspnea. Symptoms first began with general malaise, fatigue, subjective fever/chills. Seemed to get better prior to last weekend, then suddenly worsened with cough productive of purulent sputum, worsening dyspnea, malaise. No recurrent fever. No chest pain. Taking Robitussin DM with no improvement.  Outpatient Encounter Prescriptions as of 12/27/2012  Medication Sig Dispense Refill  . clotrimazole-betamethasone (LOTRISONE) cream APPLY TO AFFECTED AREA 2 TIMES DAILY  15 g  0  . Loratadine (CLARITIN) 10 MG CAPS Take 1 capsule by mouth daily as needed.       Marland Kitchen NEXIUM 40 MG capsule TAKE 1 CAPSULE BY MOUTH DAILY  30 capsule  5  . Omega-3 Fatty Acids (FISH OIL) 500 MG CAPS Take by mouth daily.       BP 142/90  Pulse 68  Temp 98 F (36.7 C) (Oral)  Ht 6\' 1"  (1.854 m)  Wt 221 lb (100.245 kg)  BMI 29.16 kg/m2  SpO2 95%  Review of Systems  Constitutional: Positive for fatigue. Negative for fever, chills, activity change, appetite change and unexpected weight change.  HENT: Positive for congestion, sore throat, rhinorrhea, postnasal drip and sinus pressure.   Eyes: Negative for visual disturbance.  Respiratory: Positive for cough and shortness of breath.   Cardiovascular: Negative for chest pain, palpitations and leg swelling.  Gastrointestinal: Negative for abdominal pain and abdominal distention.  Genitourinary: Negative for dysuria, urgency and difficulty urinating.  Musculoskeletal: Negative for arthralgias and gait problem.  Skin: Negative for color change and rash.  Hematological: Negative for adenopathy.  Psychiatric/Behavioral: Negative for sleep disturbance and dysphoric mood. The patient is not nervous/anxious.        Objective:   Physical Exam  Constitutional: He is oriented to person,  place, and time. He appears well-developed and well-nourished. No distress.  HENT:  Head: Normocephalic and atraumatic.  Right Ear: External ear normal.  Left Ear: External ear normal.  Nose: Nose normal.  Mouth/Throat: Oropharynx is clear and moist. No oropharyngeal exudate.  Eyes: Conjunctivae normal and EOM are normal. Pupils are equal, round, and reactive to light. Right eye exhibits no discharge. Left eye exhibits no discharge. No scleral icterus.  Neck: Normal range of motion. Neck supple. No tracheal deviation present. No thyromegaly present.  Cardiovascular: Normal rate, regular rhythm and normal heart sounds.  Exam reveals no gallop and no friction rub.   No murmur heard. Pulmonary/Chest: No accessory muscle usage. Not tachypneic. No respiratory distress. He has decreased breath sounds in the right lower field. He has no wheezes. He has rhonchi in the right lower field. He has no rales. He exhibits no tenderness.  Musculoskeletal: Normal range of motion. He exhibits no edema.  Lymphadenopathy:    He has no cervical adenopathy.  Neurological: He is alert and oriented to person, place, and time. No cranial nerve deficit. Coordination normal.  Skin: Skin is warm and dry. No rash noted. He is not diaphoretic. No erythema. No pallor.  Psychiatric: He has a normal mood and affect. His behavior is normal. Judgment and thought content normal.          Assessment & Plan:

## 2012-12-30 ENCOUNTER — Ambulatory Visit (INDEPENDENT_AMBULATORY_CARE_PROVIDER_SITE_OTHER)
Admission: RE | Admit: 2012-12-30 | Discharge: 2012-12-30 | Disposition: A | Discharge status: Home / Self Care | Payer: PRIVATE HEALTH INSURANCE | Source: Ambulatory Visit | Attending: Internal Medicine | Admitting: Internal Medicine

## 2012-12-30 ENCOUNTER — Other Ambulatory Visit: Payer: Self-pay | Admitting: Internal Medicine

## 2012-12-30 ENCOUNTER — Telehealth: Payer: Self-pay | Admitting: Internal Medicine

## 2012-12-30 DIAGNOSIS — J189 Pneumonia, unspecified organism: Secondary | ICD-10-CM

## 2012-12-30 MED ORDER — LEVOFLOXACIN 500 MG PO TABS: 500.0000 mg | ORAL_TABLET | Freq: Every day | ORAL | Status: DC

## 2012-12-30 NOTE — Telephone Encounter (Signed)
Would you mind calling this pt back? See below

## 2012-12-30 NOTE — Telephone Encounter (Signed)
Pt is calling and he is still not better after antibiotics and wants to know what he should do.

## 2012-12-30 NOTE — Telephone Encounter (Signed)
Spoke with patient and advised results appt scheduled for 1:15 12/31/12 Pt going over now for chest x-ray

## 2012-12-30 NOTE — Telephone Encounter (Signed)
I will place order for CXR at [facility] today or tomorrow. Change to Levawuin 500mg  po daily x 7 days. Follow up tomorrow if possible 1:15.

## 2012-12-31 ENCOUNTER — Ambulatory Visit (INDEPENDENT_AMBULATORY_CARE_PROVIDER_SITE_OTHER): Payer: PRIVATE HEALTH INSURANCE | Admitting: Internal Medicine

## 2012-12-31 ENCOUNTER — Encounter: Payer: Self-pay | Admitting: Internal Medicine

## 2012-12-31 VITALS — BP 138/88 | HR 72 | Temp 98.0°F | Wt 223.0 lb

## 2012-12-31 DIAGNOSIS — J189 Pneumonia, unspecified organism: Secondary | ICD-10-CM

## 2012-12-31 NOTE — Progress Notes (Signed)
Subjective:    Patient ID: Richard Smith, male    DOB: 1946-11-17, 67 y.o.   MRN: [id number]  HPI 67YO male presents for follow up recent episode of pneumonia. Was seen in clinic 1/27 with productive cough, chills, fatigue and exam findings suggestive of early pneumonia. Started Augmentin, however symptoms continued to worsen with progressive fatigue, dyspnea, productive cough. Antibiotic was changed to Levaquin yesterday and pt reports this morning energy level seems improved, cough improving. No recurrent fever. CXR yesterday showed no acute infiltrate, but did note elevation of right hemidiaphragm.  Outpatient Encounter Prescriptions as of 12/31/2012  Medication Sig Dispense Refill  . clotrimazole-betamethasone (LOTRISONE) cream APPLY TO AFFECTED AREA 2 TIMES DAILY  15 g  0  . levofloxacin (LEVAQUIN) 500 MG tablet Take 1 tablet (500 mg total) by mouth daily.  7 tablet  0  . Loratadine (CLARITIN) 10 MG CAPS Take 1 capsule by mouth daily as needed.       . metoCLOPramide (REGLAN) 10 MG tablet TAKE 1 TABLET BY MOUTH 3 TIMES DAILY AS NEEDED  90 tablet  0  . NEXIUM 40 MG capsule TAKE 1 CAPSULE BY MOUTH DAILY  30 capsule  5  . Omega-3 Fatty Acids (FISH OIL) 500 MG CAPS Take by mouth daily.      . [DISCONTINUED] amoxicillin-clavulanate (AUGMENTIN) 875-125 MG per tablet Take 1 tablet by mouth 2 (two) times daily.  20 tablet  0    Review of Systems  Constitutional: Positive for fatigue. Negative for fever, chills, activity change, appetite change and unexpected weight change.  HENT: Positive for congestion, rhinorrhea, postnasal drip and sinus pressure. Negative for sore throat and trouble swallowing.   Eyes: Negative for visual disturbance.  Respiratory: Positive for cough. Negative for shortness of breath.   Cardiovascular: Negative for chest pain, palpitations and leg swelling.  Gastrointestinal: Negative for abdominal pain and abdominal distention.  Genitourinary: Negative for dysuria, urgency  and difficulty urinating.  Musculoskeletal: Negative for arthralgias and gait problem.  Skin: Negative for color change and rash.  Hematological: Negative for adenopathy.  Psychiatric/Behavioral: Negative for sleep disturbance and dysphoric mood. The patient is not nervous/anxious.        Objective:   Physical Exam  Constitutional: He is oriented to person, place, and time. He appears well-developed and well-nourished. No distress.  HENT:  Head: Normocephalic and atraumatic.  Right Ear: External ear normal.  Left Ear: External ear normal.  Nose: Nose normal.  Mouth/Throat: Oropharynx is clear and moist. No oropharyngeal exudate.  Eyes: Conjunctivae normal and EOM are normal. Pupils are equal, round, and reactive to light. Right eye exhibits no discharge. Left eye exhibits no discharge. No scleral icterus.  Neck: Normal range of motion. Neck supple. No tracheal deviation present. No thyromegaly present.  Cardiovascular: Normal rate, regular rhythm and normal heart sounds.  Exam reveals no gallop and no friction rub.   No murmur heard. Pulmonary/Chest: Effort normal and breath sounds normal. No accessory muscle usage. Not tachypneic. No respiratory distress. He has no decreased breath sounds. He has no wheezes. He has no rhonchi. He has no rales. He exhibits no tenderness.  Musculoskeletal: Normal range of motion. He exhibits no edema.  Lymphadenopathy:    He has no cervical adenopathy.  Neurological: He is alert and oriented to person, place, and time. No cranial nerve deficit. Coordination normal.  Skin: Skin is warm and dry. No rash noted. He is not diaphoretic. No erythema. No pallor.  Psychiatric: He has a normal mood and  affect. His behavior is normal. Judgment and thought content normal.          Assessment & Plan:

## 2012-12-31 NOTE — Assessment & Plan Note (Signed)
Symptoms of pneumonia improving with change in antibiotic to Levaquin. CXR 1/30 showed no acute infiltrate, however noted elevation of right hemidiaphragm. Will finish course of levaquin and have him RTC in 2 weeks for recheck. He will call sooner if any recurrent fever, chills, worsening cough or chest pain.

## 2013-01-15 ENCOUNTER — Other Ambulatory Visit: Payer: Self-pay

## 2013-02-14 ENCOUNTER — Encounter: Payer: Self-pay | Admitting: Internal Medicine

## 2013-03-28 ENCOUNTER — Telehealth: Payer: Self-pay | Admitting: *Deleted

## 2013-03-28 NOTE — Telephone Encounter (Signed)
Called [phone] for Prior Authorization on the Nexium 40 mg, it was APPROVED 03.29.2014-04.28.2015

## 2013-03-29 ENCOUNTER — Telehealth: Payer: Self-pay | Admitting: *Deleted

## 2013-03-29 NOTE — Telephone Encounter (Signed)
Received a Fax from [facility], Nexium Capsules have been APPROVED   03.29.2014-04.28.2015

## 2013-04-27 ENCOUNTER — Other Ambulatory Visit: Payer: Self-pay | Admitting: *Deleted

## 2013-04-27 MED ORDER — ESOMEPRAZOLE MAGNESIUM 40 MG PO CPDR: DELAYED_RELEASE_CAPSULE | ORAL | Status: DC

## 2013-04-27 NOTE — Telephone Encounter (Signed)
Eprescribed.

## 2013-05-09 ENCOUNTER — Telehealth: Payer: Self-pay | Admitting: Internal Medicine

## 2013-05-09 MED ORDER — ESOMEPRAZOLE MAGNESIUM 40 MG PO CPDR: DELAYED_RELEASE_CAPSULE | ORAL | Status: DC

## 2013-05-09 NOTE — Telephone Encounter (Signed)
Rx sent to pharmacy   

## 2013-05-09 NOTE — Telephone Encounter (Signed)
We can refill his Nexium for another year.

## 2013-05-09 NOTE — Telephone Encounter (Signed)
Patient wanting to know does he need a office visit or labs done for his medication refill on his nexium.

## 2013-05-09 NOTE — Telephone Encounter (Signed)
Will he need labs or to schedule an appointment, last OV was in December

## 2013-05-25 ENCOUNTER — Telehealth: Payer: Self-pay | Admitting: Internal Medicine

## 2013-05-25 NOTE — Telephone Encounter (Signed)
Pt checking on his nexium rx

## 2013-05-26 MED ORDER — ESOMEPRAZOLE MAGNESIUM 40 MG PO CPDR: DELAYED_RELEASE_CAPSULE | ORAL | Status: DC

## 2013-05-26 NOTE — Telephone Encounter (Signed)
Spoke with patient, he was waiting on prescription for Nexium. Informed patient prescription was sent to [facility] in [place]. Per patient it should have been sent to [facility], I re-sent it to [facility] and informed patient if he calls the office like he did last time then please let them know exactly which pharmacy to send prescription to.

## 2013-07-06 ENCOUNTER — Other Ambulatory Visit: Payer: Self-pay

## 2013-07-26 DIAGNOSIS — S838X9A Sprain of other specified parts of unspecified knee, initial encounter: Secondary | ICD-10-CM | POA: Diagnosis not present

## 2013-07-26 DIAGNOSIS — H01119 Allergic dermatitis of unspecified eye, unspecified eyelid: Secondary | ICD-10-CM | POA: Diagnosis not present

## 2013-08-17 DIAGNOSIS — S838X9A Sprain of other specified parts of unspecified knee, initial encounter: Secondary | ICD-10-CM | POA: Diagnosis not present

## 2013-08-22 ENCOUNTER — Encounter: Payer: Self-pay | Admitting: Adult Health

## 2013-08-22 ENCOUNTER — Ambulatory Visit (INDEPENDENT_AMBULATORY_CARE_PROVIDER_SITE_OTHER): Payer: Medicare Other | Admitting: Adult Health

## 2013-08-22 VITALS — BP 118/74 | HR 67 | Temp 97.6°F | Resp 12 | Wt 219.0 lb

## 2013-08-22 DIAGNOSIS — J329 Chronic sinusitis, unspecified: Secondary | ICD-10-CM | POA: Insufficient documentation

## 2013-08-22 MED ORDER — AMOXICILLIN-POT CLAVULANATE 875-125 MG PO TABS: 1.0000 | ORAL_TABLET | Freq: Two times a day (BID) | ORAL | Status: DC

## 2013-08-22 NOTE — Patient Instructions (Addendum)
Start Augmentin twice a day for 10 days.  Please call if symptoms not improved in 3-4 days.   Sinusitis is a condition that can cause a stuffy nose, pain in the face, and yellow or green discharge (mucus) from the nose. The sinuses are hollow areas in the bones of the face. They have a thin lining that normally makes a small amount of mucus. When this lining gets infected, it swells and makes extra mucus. This causes symptoms.   Sinusitis can occur when a person gets sick with a cold. The germs causing the cold can also infect the sinuses. Many times, a person feels like his or her cold is getting better. But then he or she gets sinusitis and begins to feel sick again.  What are the symptoms of sinusitis? - Common symptoms of sinusitis include:  Stuffy or blocked nose  Thick yellow or green discharge from the nose  Pain in the teeth  Pain or pressure in the face - This often feels worse when a person bends forward.   People with sinusitis can also have other symptoms that include:  Fever  Cough  Trouble smelling  Ear pressure or fullness  Headache  Bad breath  Feeling tired   Most of the time, symptoms start to improve in 7 to 10 days.  See your doctor or nurse if your symptoms last more than 7 days, or if your symptoms get better at first but then get worse.  Sometimes, sinusitis can lead to serious problems. See your doctor or nurse right away (do not wait 7 days) if you have:  Fever higher than 102.55F (39.2C)  Sudden and severe pain in the face and head  Trouble seeing or seeing double  Trouble thinking clearly  Swelling or redness around 1 or both eyes  Trouble breathing or a stiff neck   Is there anything I can do on my own to feel better? - Yes. To reduce your symptoms, you can: Take an over-the-counter pain reliever to reduce the pain  Rinse your nose and sinuses with salt water a few times a day - Ask your doctor or nurse about the best way to do this.  Use a  decongestant nose spray - These sprays are sold in a pharmacy. But do not use decongestant nose sprays for more than 2 to 3 days in a row. Using them more than 3 days in a row can make symptoms worse.   You should NOT take an antihistamine for sinusitis. Common antihistamines include diphenhydramine (sample brand name: Benadryl), chlorpheniramine (sample brand name: Chlor-Trimeton), loratadine (sample brand name: Claritin), and cetirizine (sample brand name: Zyrtec). They can treat allergies, but not sinus infections, and could increase your discomfort by drying the lining of your nose and sinuses, or making you tired.   Your doctor might also prescribe a steroid nose spray to reduce the swelling in your nose. (Steroid nose sprays do not contain the same steroids that athletes take to build muscle.)  How is sinusitis treated? - Most of the time, sinusitis does not need to be treated with antibiotic medicines. This is because most sinusitis is caused by viruses - not bacteria - and antibiotics do not kill viruses. Many people get over sinus infections without antibiotics.  Some people with sinusitis do need treatment with antibiotics. If your symptoms have not improved after 7 to 10 days, ask your doctor if you should take antibiotics. Your doctor might recommend that you wait 1 more week to  see if your symptoms improve. But if you have symptoms such as a fever or a lot of pain, he or she might prescribe antibiotics. It is important to follow your doctor's instructions about taking your antibiotics.

## 2013-08-22 NOTE — Progress Notes (Signed)
  Subjective:    Patient ID: Richard Smith, male    DOB: 10-Nov-1946, 67 y.o.   MRN: [id number]  HPI  Pt is pleasant 67 yo male, states he developed productive cough with green phlegm Friday morning with fever up to 101.  Pt denies headache, sinus pressure, facial pain, sore throat, or neck pain.  Pt states he still coughing up greenish phlegm but not as much.  Pt states he's been taking Naproxen for fever. He reports developing several sinus infections throughout the year. 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 He is requesting a prescription for an antibiotic to take with him when he travels in the event of developing another sinus infection.  Current Outpatient Prescriptions on File Prior to Visit  Medication Sig Dispense Refill  . esomeprazole (NEXIUM) 40 MG capsule TAKE 1 CAPSULE BY MOUTH DAILY  90 capsule  2  . Loratadine (CLARITIN) 10 MG CAPS Take 1 capsule by mouth daily as needed.       . metoCLOPramide (REGLAN) 10 MG tablet TAKE 1 TABLET BY MOUTH 3 TIMES DAILY AS NEEDED  90 tablet  0  . Omega-3 Fatty Acids (FISH OIL) 500 MG CAPS Take by mouth daily.      . clotrimazole-betamethasone (LOTRISONE) cream APPLY TO AFFECTED AREA 2 TIMES DAILY  15 g  0   No current facility-administered medications on file prior to visit.     Review of Systems  Constitutional: Positive for fever.  HENT: Negative.   Eyes: Negative.   Respiratory: Positive for cough. Negative for chest tightness, shortness of breath and wheezing.        Productive cough with green phlegm  Gastrointestinal: Negative.   Skin: Negative.   Neurological: Negative.   Psychiatric/Behavioral: Negative.        Objective:   Physical Exam  Constitutional: He appears well-developed and well-nourished.  HENT:  Head: Normocephalic.  Nose: Nose normal.  Mouth/Throat: Oropharynx is clear and moist.  Pharyngeal erythema  Eyes: Conjunctivae are normal.  Neck: Normal range of motion. Neck supple.   Cardiovascular: Normal rate and regular rhythm.   Pulmonary/Chest: Effort normal and breath sounds normal. No respiratory distress. He has no wheezes.  Skin: Skin is warm and dry.  Psychiatric: He has a normal mood and affect. His behavior is normal. Judgment and thought content normal.    BP 118/74  Pulse 67  Temp(Src) 97.6 F (36.4 C) (Oral)  Resp 12  Wt 219 lb (99.338 kg)  BMI 28.9 kg/m2  SpO2 96%     Assessment & Plan:

## 2013-08-22 NOTE — Assessment & Plan Note (Signed)
Green productive sputum and fever of 101. Start Augmentin twice a day x10 days. Provided patient with refill on this medication for his upcoming trip to [place] in January.

## 2013-08-25 ENCOUNTER — Ambulatory Visit (INDEPENDENT_AMBULATORY_CARE_PROVIDER_SITE_OTHER): Payer: Medicare Other | Admitting: Adult Health

## 2013-08-25 ENCOUNTER — Encounter: Payer: Self-pay | Admitting: Adult Health

## 2013-08-25 VITALS — BP 114/66 | HR 73 | Temp 98.1°F | Resp 12 | Wt 219.0 lb

## 2013-08-25 DIAGNOSIS — Z1211 Encounter for screening for malignant neoplasm of colon: Secondary | ICD-10-CM

## 2013-08-25 DIAGNOSIS — K219 Gastro-esophageal reflux disease without esophagitis: Secondary | ICD-10-CM | POA: Diagnosis not present

## 2013-08-25 DIAGNOSIS — J329 Chronic sinusitis, unspecified: Secondary | ICD-10-CM

## 2013-08-25 MED ORDER — PREDNISONE 10 MG PO TABS: ORAL_TABLET | ORAL | Status: DC

## 2013-08-25 NOTE — Assessment & Plan Note (Signed)
Significant symptoms of acid reflux occurred approximately 3-4 days ago. He is requesting referral to GI as he has not had a screening endoscopy or colonoscopy in greater than 10 years. Referral made

## 2013-08-25 NOTE — Assessment & Plan Note (Signed)
Continue Augmentin. Start prednisone taper 60 mg decreasing by 10 mg daily until done. RTC if symptoms are not improved within 4-5 days

## 2013-08-25 NOTE — Progress Notes (Signed)
  Subjective:    Patient ID: Richard Smith, male    DOB: 1946/04/06, 67 y.o.   MRN: [id number]  HPI Patient is a pleasant 67 year old male who was seen in clinic on 08/22/2013 for her sinus symptoms. He was started on Augmentin. He presents to clinic today with concerns of wheezing and not really feeling that he is improving as much as he should. He is still coughing, however, the cough is not keeping him up at night. He is taking his Augmentin exactly as prescribed.   Also patient reports having episode of significant acid reflux. He reports he has not felt this burning sensation in years. It has been over 10 years that he has seen a GI specialist for screening colonoscopy or endoscopy and is requesting a referral for same.   Current Outpatient Prescriptions on File Prior to Visit  Medication Sig Dispense Refill  . amoxicillin-clavulanate (AUGMENTIN) 875-125 MG per tablet Take 1 tablet by mouth 2 (two) times daily.  20 tablet  1  . clotrimazole-betamethasone (LOTRISONE) cream APPLY TO AFFECTED AREA 2 TIMES DAILY  15 g  0  . esomeprazole (NEXIUM) 40 MG capsule TAKE 1 CAPSULE BY MOUTH DAILY  90 capsule  2  . Loratadine (CLARITIN) 10 MG CAPS Take 1 capsule by mouth daily as needed.       . metoCLOPramide (REGLAN) 10 MG tablet TAKE 1 TABLET BY MOUTH 3 TIMES DAILY AS NEEDED  90 tablet  0  . Omega-3 Fatty Acids (FISH OIL) 500 MG CAPS Take by mouth daily.       No current facility-administered medications on file prior to visit.     Review of Systems  Constitutional: Negative.   Respiratory: Positive for cough and wheezing.   Cardiovascular: Negative.   Gastrointestinal:       Acid reflux  Genitourinary: Negative.   Neurological: Negative.   Psychiatric/Behavioral: Negative.        Objective:   Physical Exam  Constitutional: He is oriented to person, place, and time. He appears well-developed and well-nourished. No distress.  Cardiovascular: Normal rate, regular rhythm and normal heart  sounds.  Exam reveals no gallop and no friction rub.   No murmur heard. Pulmonary/Chest: Effort normal and breath sounds normal. No respiratory distress. He has no wheezes. He has no rales.  Good air movement throughout  Lymphadenopathy:    He has no cervical adenopathy.  Neurological: He is alert and oriented to person, place, and time.  Skin: Skin is warm and dry.  Psychiatric: He has a normal mood and affect. His behavior is normal. Judgment and thought content normal.          Assessment & Plan:

## 2013-08-25 NOTE — Patient Instructions (Addendum)
  I have sent in a prescription for prednisone. Take 6 tablets on the first day and then decrease by 1 tablet daily until done.  I am referring you to GI for your screening colonoscopy and endoscopy. They will contact you with an appointment.

## 2013-08-26 ENCOUNTER — Encounter: Payer: Self-pay | Admitting: Internal Medicine

## 2013-09-28 DIAGNOSIS — Z1211 Encounter for screening for malignant neoplasm of colon: Secondary | ICD-10-CM | POA: Diagnosis not present

## 2013-09-28 DIAGNOSIS — R12 Heartburn: Secondary | ICD-10-CM | POA: Diagnosis not present

## 2013-09-28 DIAGNOSIS — K219 Gastro-esophageal reflux disease without esophagitis: Secondary | ICD-10-CM | POA: Diagnosis not present

## 2013-10-06 ENCOUNTER — Other Ambulatory Visit: Payer: Self-pay

## 2013-11-02 ENCOUNTER — Other Ambulatory Visit: Payer: Self-pay | Admitting: Internal Medicine

## 2013-11-02 NOTE — Telephone Encounter (Signed)
Refill

## 2014-02-28 ENCOUNTER — Ambulatory Visit: Payer: Self-pay | Admitting: Gastroenterology

## 2014-02-28 ENCOUNTER — Other Ambulatory Visit: Payer: Self-pay | Admitting: Internal Medicine

## 2014-02-28 DIAGNOSIS — K5730 Diverticulosis of large intestine without perforation or abscess without bleeding: Secondary | ICD-10-CM | POA: Diagnosis not present

## 2014-02-28 DIAGNOSIS — K2270 Barrett's esophagus without dysplasia: Secondary | ICD-10-CM | POA: Diagnosis not present

## 2014-02-28 DIAGNOSIS — K621 Rectal polyp: Secondary | ICD-10-CM | POA: Diagnosis not present

## 2014-02-28 DIAGNOSIS — K648 Other hemorrhoids: Secondary | ICD-10-CM | POA: Diagnosis not present

## 2014-02-28 DIAGNOSIS — Z1211 Encounter for screening for malignant neoplasm of colon: Secondary | ICD-10-CM | POA: Diagnosis not present

## 2014-02-28 DIAGNOSIS — K5289 Other specified noninfective gastroenteritis and colitis: Secondary | ICD-10-CM | POA: Diagnosis not present

## 2014-02-28 DIAGNOSIS — K620 Anal polyp: Secondary | ICD-10-CM | POA: Diagnosis not present

## 2014-02-28 DIAGNOSIS — D131 Benign neoplasm of stomach: Secondary | ICD-10-CM | POA: Diagnosis not present

## 2014-02-28 DIAGNOSIS — R1013 Epigastric pain: Secondary | ICD-10-CM | POA: Diagnosis not present

## 2014-02-28 DIAGNOSIS — J309 Allergic rhinitis, unspecified: Secondary | ICD-10-CM | POA: Diagnosis not present

## 2014-02-28 DIAGNOSIS — K2100 Gastro-esophageal reflux disease with esophagitis, without bleeding: Secondary | ICD-10-CM | POA: Diagnosis not present

## 2014-02-28 DIAGNOSIS — K2960 Other gastritis without bleeding: Secondary | ICD-10-CM | POA: Diagnosis not present

## 2014-02-28 DIAGNOSIS — D126 Benign neoplasm of colon, unspecified: Secondary | ICD-10-CM | POA: Diagnosis not present

## 2014-02-28 DIAGNOSIS — Z79899 Other long term (current) drug therapy: Secondary | ICD-10-CM | POA: Diagnosis not present

## 2014-02-28 DIAGNOSIS — K2970 Gastritis, unspecified, without bleeding: Secondary | ICD-10-CM | POA: Diagnosis not present

## 2014-02-28 DIAGNOSIS — K2990 Gastroduodenitis, unspecified, without bleeding: Secondary | ICD-10-CM | POA: Diagnosis not present

## 2014-02-28 DIAGNOSIS — K449 Diaphragmatic hernia without obstruction or gangrene: Secondary | ICD-10-CM | POA: Diagnosis not present

## 2014-02-28 DIAGNOSIS — K219 Gastro-esophageal reflux disease without esophagitis: Secondary | ICD-10-CM | POA: Diagnosis not present

## 2014-02-28 DIAGNOSIS — K2940 Chronic atrophic gastritis without bleeding: Secondary | ICD-10-CM | POA: Diagnosis not present

## 2014-03-02 ENCOUNTER — Other Ambulatory Visit: Payer: Self-pay | Admitting: Internal Medicine

## 2014-03-02 LAB — PATHOLOGY REPORT

## 2014-03-16 ENCOUNTER — Encounter: Payer: Self-pay | Admitting: Internal Medicine

## 2014-03-16 DIAGNOSIS — K2270 Barrett's esophagus without dysplasia: Secondary | ICD-10-CM | POA: Diagnosis not present

## 2014-03-16 DIAGNOSIS — R197 Diarrhea, unspecified: Secondary | ICD-10-CM | POA: Diagnosis not present

## 2014-03-16 MED ORDER — CELECOXIB 200 MG PO CAPS: 200.0000 mg | ORAL_CAPSULE | Freq: Every day | ORAL | Status: DC

## 2014-03-16 NOTE — Telephone Encounter (Signed)
Medication advice needed.

## 2014-05-20 ENCOUNTER — Other Ambulatory Visit: Payer: Self-pay | Admitting: Internal Medicine

## 2014-05-22 NOTE — Telephone Encounter (Signed)
Refill

## 2014-08-19 ENCOUNTER — Encounter: Payer: Self-pay | Admitting: Adult Health

## 2014-08-21 MED ORDER — PREDNISONE 10 MG PO TABS: ORAL_TABLET | ORAL | Status: DC

## 2014-08-21 MED ORDER — LEVOFLOXACIN 500 MG PO TABS: 500.0000 mg | ORAL_TABLET | Freq: Every day | ORAL | Status: DC

## 2014-08-29 ENCOUNTER — Other Ambulatory Visit: Payer: Self-pay | Admitting: Internal Medicine

## 2014-10-21 ENCOUNTER — Other Ambulatory Visit: Payer: Self-pay | Admitting: Internal Medicine

## 2014-11-07 ENCOUNTER — Encounter: Payer: Self-pay | Admitting: Internal Medicine

## 2014-11-07 ENCOUNTER — Ambulatory Visit (INDEPENDENT_AMBULATORY_CARE_PROVIDER_SITE_OTHER): Payer: Medicare Other | Admitting: Internal Medicine

## 2014-11-07 VITALS — BP 125/78 | HR 79 | Temp 97.6°F | Ht 73.0 in | Wt 223.2 lb

## 2014-11-07 DIAGNOSIS — Z1283 Encounter for screening for malignant neoplasm of skin: Secondary | ICD-10-CM

## 2014-11-07 DIAGNOSIS — Z125 Encounter for screening for malignant neoplasm of prostate: Secondary | ICD-10-CM

## 2014-11-07 DIAGNOSIS — E785 Hyperlipidemia, unspecified: Secondary | ICD-10-CM | POA: Diagnosis not present

## 2014-11-07 DIAGNOSIS — Z0000 Encounter for general adult medical examination without abnormal findings: Secondary | ICD-10-CM | POA: Diagnosis not present

## 2014-11-07 DIAGNOSIS — J3089 Other allergic rhinitis: Secondary | ICD-10-CM | POA: Diagnosis not present

## 2014-11-07 DIAGNOSIS — K2270 Barrett's esophagus without dysplasia: Secondary | ICD-10-CM | POA: Diagnosis not present

## 2014-11-07 MED ORDER — CLOTRIMAZOLE-BETAMETHASONE 1-0.05 % EX CREA: TOPICAL_CREAM | Freq: Two times a day (BID) | CUTANEOUS | Status: AC

## 2014-11-07 MED ORDER — TRIAMCINOLONE ACETONIDE 55 MCG/ACT NA AERO: 2.0000 | INHALATION_SPRAY | Freq: Every day | NASAL | Status: DC

## 2014-11-07 MED ORDER — TRIAMCINOLONE ACETONIDE 0.1 % EX CREA
1.0000 | TOPICAL_CREAM | Freq: Two times a day (BID) | CUTANEOUS | Status: AC
Start: 2014-11-07 — End: ?

## 2014-11-07 NOTE — Patient Instructions (Signed)
Labs today

## 2014-11-07 NOTE — Progress Notes (Signed)
Pre visit review using our clinic review tool, if applicable. No additional management support is needed unless otherwise documented below in the visit note. 

## 2014-11-07 NOTE — Progress Notes (Signed)
Subjective:    Patient ID: Richard Smith, male    DOB: [birth year], 68 y.o.   MRN: [id number]  HPI 68YO male presents for acute visit.  Would like to set up an evaluation with dermatology for skin exam.  Also concerned about recent diagnosis of Barrett's esophagus. Scheduled for follow up EGD in 3 years, however questions if this should be sooner. Taking Nexium daily. No symptoms of abdominal pain, nausea.  Post nasal drip - chronic, more pronounced in the mornings.  No sneezing. No cough. Stopped taking Loratadine, then restarted with some improvement. 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 No environmental allergy testing. Has a dog and 2 cats in home. Has both carpeted and hard floors in home.    Past medical, surgical, family and social history per today's encounter.  Review of Systems  Constitutional: Negative for fever, chills, activity change, appetite change, fatigue and unexpected weight change.  HENT: Positive for postnasal drip and rhinorrhea. Negative for congestion, sinus pressure, sneezing, sore throat and voice change.   Eyes: Negative for visual disturbance.  Respiratory: Negative for cough and shortness of breath.   Cardiovascular: Negative for chest pain, palpitations and leg swelling.  Gastrointestinal: Negative for nausea, vomiting, abdominal pain, diarrhea, constipation and abdominal distention.  Genitourinary: Negative for dysuria, urgency and difficulty urinating.  Musculoskeletal: Negative for arthralgias and gait problem.  Skin: Negative for color change and rash.  Hematological: Negative for adenopathy.  Psychiatric/Behavioral: Negative for sleep disturbance and dysphoric mood. The patient is not nervous/anxious.        Objective:    BP 125/78 mmHg  Pulse 79  Temp(Src) 97.6 F (36.4 C) (Oral)  Ht 6\' 1"  (1.854 m)  Wt 223 lb 4 oz (101.266 kg)  BMI 29.46 kg/m2  SpO2 95% Physical Exam  Constitutional: He is  oriented to person, place, and time. He appears well-developed and well-nourished. No distress.  HENT:  Head: Normocephalic and atraumatic.  Right Ear: External ear normal.  Left Ear: External ear normal.  Nose: Mucosal edema present.    Mouth/Throat: Oropharynx is clear and moist. No oropharyngeal exudate.  Eyes: Conjunctivae and EOM are normal. Pupils are equal, round, and reactive to light. Right eye exhibits no discharge. Left eye exhibits no discharge. No scleral icterus.  Neck: Normal range of motion. Neck supple. No tracheal deviation present. No thyromegaly present.  Cardiovascular: Normal rate, regular rhythm and normal heart sounds.  Exam reveals no gallop and no friction rub.   No murmur heard. Pulmonary/Chest: Effort normal and breath sounds normal. No accessory muscle usage. No tachypnea. No respiratory distress. He has no decreased breath sounds. He has no wheezes. He has no rhonchi. He has no rales. He exhibits no tenderness.  Musculoskeletal: Normal range of motion. He exhibits no edema.  Lymphadenopathy:    He has no cervical adenopathy.  Neurological: He is alert and oriented to person, place, and time. No cranial nerve deficit. Coordination normal.  Skin: Skin is warm and dry. No rash noted. He is not diaphoretic. No erythema. No pallor.  Psychiatric: He has a normal mood and affect. His behavior is normal. Judgment and thought content normal.          Assessment & Plan:   Problem List Items Addressed This Visit      Unprioritized   Barrett esophagus    Reviewed recent EGD and surg path. Reviewed guidelines for surveillance of Barrett's esophagus. Based on EGD report, I cannot  tell that biopsies were taken at 2cm interval, so it is not clear to me that he can wait 3 years for repeat EGD. Question if he may need EGD at 1 year interval in 01/2015. Will set up evaluation with Dr. Hilarie Fredrickson.    Relevant Orders      Ambulatory referral to Gastroenterology   Other  allergic rhinitis - Primary    Symptoms most consistent with allergic rhinitis. Will continue loratadine and add Nasacort. Will set up ENT evaluation for possible allergy testing.    Relevant Medications      NASACORT AQ 55 MCG/ACT NA AERS   Other Relevant Orders      Ambulatory referral to ENT   Screening exam for skin cancer    Will set up screening evaluation with dermatology.    Relevant Orders      Ambulatory referral to Dermatology    Other Visit Diagnoses    Routine general medical examination at a health care facility        Relevant Orders       CBC with Differential       Comprehensive metabolic panel       Lipid panel       Microalbumin / creatinine urine ratio       PSA, Medicare        Return in about 3 months (around 02/06/2015) for Wellness Visit.

## 2014-11-07 NOTE — Assessment & Plan Note (Signed)
Symptoms most consistent with allergic rhinitis. Will continue loratadine and add Nasacort. Will set up ENT evaluation for possible allergy testing.

## 2014-11-07 NOTE — Assessment & Plan Note (Signed)
Will set up screening evaluation with dermatology.

## 2014-11-07 NOTE — Assessment & Plan Note (Signed)
Reviewed recent EGD and surg path. Reviewed guidelines for surveillance of Barrett's esophagus. Based on EGD report, I cannot tell that biopsies were taken at 2cm interval, so it is not clear to me that he can wait 3 years for repeat EGD. Question if he may need EGD at 1 year interval in 01/2015. Will set up evaluation with Dr. Hilarie Fredrickson.

## 2014-11-08 LAB — CBC WITH DIFFERENTIAL/PLATELET
BASOS ABS: 0 10*3/uL (ref 0.0–0.1)
Basophils Relative: 0.4 % (ref 0.0–3.0)
EOS PCT: 4.6 % (ref 0.0–5.0)
Eosinophils Absolute: 0.4 10*3/uL (ref 0.0–0.7)
HEMATOCRIT: 48.3 % (ref 39.0–52.0)
Hemoglobin: 16.3 g/dL (ref 13.0–17.0)
LYMPHS ABS: 3.6 10*3/uL (ref 0.7–4.0)
Lymphocytes Relative: 37.2 % (ref 12.0–46.0)
MCHC: 33.7 g/dL (ref 30.0–36.0)
MCV: 90.3 fl (ref 78.0–100.0)
MONO ABS: 0.8 10*3/uL (ref 0.1–1.0)
Monocytes Relative: 8.1 % (ref 3.0–12.0)
NEUTROS PCT: 49.7 % (ref 43.0–77.0)
Neutro Abs: 4.8 10*3/uL (ref 1.4–7.7)
PLATELETS: 265 10*3/uL (ref 150.0–400.0)
RBC: 5.34 Mil/uL (ref 4.22–5.81)
RDW: 12.9 % (ref 11.5–15.5)
WBC: 9.7 10*3/uL (ref 4.0–10.5)

## 2014-11-08 LAB — LIPID PANEL
CHOL/HDL RATIO: 5
CHOLESTEROL: 247 mg/dL — AB (ref 0–200)
HDL: 45.8 mg/dL (ref >39.00)
NonHDL: 201.2
TRIGLYCERIDES: 262 mg/dL — AB (ref 0.0–149.0)
VLDL: 52.4 mg/dL — AB (ref 0.0–40.0)

## 2014-11-08 LAB — COMPREHENSIVE METABOLIC PANEL
ALBUMIN: 4.2 g/dL (ref 3.5–5.2)
ALK PHOS: 71 U/L (ref 39–117)
ALT: 33 U/L (ref 0–53)
AST: 31 U/L (ref 0–37)
BUN: 14 mg/dL (ref 6–23)
CO2: 28 mEq/L (ref 19–32)
Calcium: 9.2 mg/dL (ref 8.4–10.5)
Chloride: 102 mEq/L (ref 96–112)
Creatinine, Ser: 1.2 mg/dL (ref 0.4–1.5)
GFR: 65.22 mL/min (ref >60.00)
GLUCOSE: 93 mg/dL (ref 70–99)
POTASSIUM: 4.2 meq/L (ref 3.5–5.1)
SODIUM: 138 meq/L (ref 135–145)
TOTAL PROTEIN: 6.8 g/dL (ref 6.0–8.3)
Total Bilirubin: 0.7 mg/dL (ref 0.2–1.2)

## 2014-11-08 LAB — LDL CHOLESTEROL, DIRECT: LDL DIRECT: 174.2 mg/dL

## 2014-11-08 LAB — MICROALBUMIN / CREATININE URINE RATIO
CREATININE, U: 200.2 mg/dL
Microalb Creat Ratio: 0.6 mg/g (ref 0.0–30.0)
Microalb, Ur: 1.2 mg/dL (ref 0.0–1.9)

## 2014-11-08 LAB — PSA, MEDICARE: PSA: 0.81 ng/ml (ref 0.10–4.00)

## 2014-11-15 ENCOUNTER — Encounter: Payer: Self-pay | Admitting: Internal Medicine

## 2014-11-15 ENCOUNTER — Ambulatory Visit (INDEPENDENT_AMBULATORY_CARE_PROVIDER_SITE_OTHER): Payer: Medicare Other | Admitting: Internal Medicine

## 2014-11-15 VITALS — BP 132/70 | HR 68 | Ht 73.0 in | Wt 224.6 lb

## 2014-11-15 DIAGNOSIS — K2270 Barrett's esophagus without dysplasia: Secondary | ICD-10-CM

## 2014-11-15 MED ORDER — DEXLANSOPRAZOLE 60 MG PO CPDR: 60.0000 mg | DELAYED_RELEASE_CAPSULE | Freq: Every day | ORAL | Status: DC

## 2014-11-15 NOTE — Progress Notes (Signed)
HISTORY OF PRESENT ILLNESS:  Richard Smith is a 68 y.o. male with past medical history as listed below who is sent today by his primary care physician regarding a second opinion after recent endoscopic evaluations elsewhere. Patient has a greater than 15 year history of chronic reflux as manifested by pyrosis and regurgitation. He has been on Nexium 40 mg daily for many years. Despite compliance with medication, he does describe breakthrough symptoms not infrequently. He denies dysphagia. The patient did undergo screening colonoscopy and upper endoscopy for chronic GERD with Dr. Verl Blalock 03/18/2000. Colonoscopy revealed mild diverticulosis but was otherwise normal. Upper endoscopy revealed a moderate size hiatal hernia but was otherwise normal. No Barrett's esophagus. He underwent repeat colonoscopy and upper endoscopy 02/28/2014 with Dr. Loistine Simas [facility]. Colonoscopy revealed left-sided diverticulosis and diminutive colon polyps which were removed. None of the polyps were adenomatous. Upper endoscopy was performed. This revealed evidence of grade A esophagitis. Esophagus was said to be otherwise normal. Nonspecific gastric erythema and a diminutive gastric polyp. Gastric biopsies revealed nonspecific gastritis. The polyp was benign fundic gland type. 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 The patient was told that he had Barrett's esophagus and was to undergo repeat upper endoscopy in 2 years. He was unsatisfied with his care and unanswered questions. He requested evaluation elsewhere and was thus referred. I have reviewed the images from his endoscopy report. Currently, the patient continues with breakthrough reflux symptoms despite once daily Nexium. GI review of systems is otherwise entirely negative.  REVIEW OF SYSTEMS:  All non-GI ROS negative except for sinus allergy, back pain  Past  Medical History  Diagnosis Date  . GERD (gastroesophageal reflux disease)   . Hyperlipidemia   . Barrett's esophagus   . Allergic rhinitis   . Colon polyps     hyperplastic  . Hiatal hernia   . Internal hemorrhoids   . Diverticulosis     Past Surgical History  Procedure Laterality Date  . Anterior cruciate ligament repair      Social History Richard Smith  reports that he quit smoking about 33 years ago. His smoking use included Cigarettes. He has a 15 pack-year smoking history. He has never used smokeless tobacco. He reports that he drinks alcohol. He reports that he does not use illicit drugs.  family history includes Heart disease in his mother.  No Known Allergies     PHYSICAL EXAMINATION: Vital signs: BP 132/70 mmHg  Pulse 68  Ht 6\' 1"  (1.854 m)  Wt 224 lb 9.6 oz (101.878 kg)  BMI 29.64 kg/m2 General: Well-developed, well-nourished, no acute distress HEENT: Sclerae are anicteric, conjunctiva pink. Oral mucosa intact Lungs: Clear Heart: Regular Abdomen: soft, nontender, nondistended, no obvious ascites, no peritoneal signs, normal bowel sounds. No organomegaly. Extremities: No edema Psychiatric: alert and oriented x3. Cooperative     ASSESSMENT:  #1. Chronic GERD. Breakthrough symptoms despite Nexium 40 mg daily. Upper endoscopy in March revealing mild esophagitis. Biopsies with ambivalent reading suggesting possible Barrett's. Looking at his endoscopy report, previous endoscopy, and the description of the biopsies, it is highly unlikely that this patient has Barrett's esophagus. I reviewed this with him in detail including drawings provided. #2. Colon cancer screening. 2 previous colonoscopies as described without neoplasia. Baseline risk.   PLAN:  #1. Reflux precautions #2. Prescribe Dexilant 60 mg daily in lieu of Nexium given breakthrough symptoms #3. Outside pathology slides requested for overreading with our pathologists (esophageal  biopsies  specifically) #4. Surveillance colonoscopy in 10 years #5. GI office follow-up in 3 months to assess response to medication and review pathology

## 2014-11-15 NOTE — Patient Instructions (Signed)
We have sent the following medications to your pharmacy for you to pick up at your convenience:    Please follow up with Dr. Henrene Pastor in 3 months

## 2014-11-22 ENCOUNTER — Telehealth: Payer: Self-pay

## 2014-11-22 DIAGNOSIS — K2950 Unspecified chronic gastritis without bleeding: Secondary | ICD-10-CM | POA: Diagnosis not present

## 2014-11-22 NOTE — Telephone Encounter (Signed)
[place] called to clarify information regarding the transferring of pt's path slides to our pathology department.  She clarified the correct location to send the slides and said the request would be processed and the slides delivered to the [facility] path department to be read by a pathologist there.

## 2014-12-08 NOTE — Telephone Encounter (Signed)
Received path report from [facility].

## 2015-01-01 DIAGNOSIS — D2261 Melanocytic nevi of right upper limb, including shoulder: Secondary | ICD-10-CM | POA: Diagnosis not present

## 2015-01-01 DIAGNOSIS — D225 Melanocytic nevi of trunk: Secondary | ICD-10-CM | POA: Diagnosis not present

## 2015-01-01 DIAGNOSIS — L821 Other seborrheic keratosis: Secondary | ICD-10-CM | POA: Diagnosis not present

## 2015-01-01 DIAGNOSIS — L570 Actinic keratosis: Secondary | ICD-10-CM | POA: Diagnosis not present

## 2015-01-01 DIAGNOSIS — X32XXXA Exposure to sunlight, initial encounter: Secondary | ICD-10-CM | POA: Diagnosis not present

## 2015-01-01 DIAGNOSIS — D2262 Melanocytic nevi of left upper limb, including shoulder: Secondary | ICD-10-CM | POA: Diagnosis not present

## 2015-03-01 ENCOUNTER — Encounter: Payer: Self-pay | Admitting: Nurse Practitioner

## 2015-03-01 ENCOUNTER — Ambulatory Visit (INDEPENDENT_AMBULATORY_CARE_PROVIDER_SITE_OTHER): Payer: Medicare Other | Admitting: Nurse Practitioner

## 2015-03-01 VITALS — BP 132/84 | HR 60 | Temp 97.5°F | Resp 14 | Ht 73.0 in | Wt 222.8 lb

## 2015-03-01 DIAGNOSIS — J301 Allergic rhinitis due to pollen: Secondary | ICD-10-CM

## 2015-03-01 DIAGNOSIS — J302 Other seasonal allergic rhinitis: Secondary | ICD-10-CM | POA: Diagnosis not present

## 2015-03-01 MED ORDER — LEVOFLOXACIN 500 MG PO TABS: 500.0000 mg | ORAL_TABLET | ORAL | Status: DC | PRN

## 2015-03-01 NOTE — Progress Notes (Signed)
   Subjective:    Patient ID: Richard Smith, male    DOB: [birth year], 69 y.o.   MRN: [id number]  HPI  Richard Smith is a 69 yo male with a CC of cough, congestion, mucous x 1.5 weeks.   1) He reports having leftover Levaquin and took 6 days of antibiotics stopped on Saturday Prednisone- took the taper- not helpful  Nasocort  Claritin   Review of Systems  Constitutional: Positive for fatigue. Negative for fever, chills and diaphoresis.  HENT: Positive for congestion and sinus pressure. Negative for sneezing.        Frontal pressure   Eyes: Negative for visual disturbance.  Respiratory: Positive for cough. Negative for chest tightness and wheezing.   Cardiovascular: Negative for chest pain, palpitations and leg swelling.  Gastrointestinal: Negative for nausea, vomiting and diarrhea.  Musculoskeletal: Positive for myalgias.      Objective:   Physical Exam  Constitutional: He is oriented to person, place, and time. He appears well-developed and well-nourished. No distress.  BP 132/84 mmHg  Pulse 60  Temp(Src) 97.5 F (36.4 C) (Oral)  Resp 14  Ht 6\' 1"  (1.854 m)  Wt 222 lb 12.8 oz (101.061 kg)  BMI 29.40 kg/m2  SpO2 98%   HENT:  Head: Normocephalic and atraumatic.  Right Ear: External ear normal.  Left Ear: External ear normal.  Mouth/Throat: No oropharyngeal exudate.  Eyes: Right eye exhibits no discharge. Left eye exhibits no discharge. No scleral icterus.  Neck: Neck supple.  Cardiovascular: Normal rate, regular rhythm, normal heart sounds and intact distal pulses.  Exam reveals no gallop and no friction rub.   No murmur heard. Pulmonary/Chest: Effort normal and breath sounds normal. No respiratory distress. He has no wheezes. He has no rales. He exhibits no tenderness.  Lymphadenopathy:    He has no cervical adenopathy.  Neurological: He is alert and oriented to person, place, and time.  Skin: Skin is warm and dry. No rash noted. He is not diaphoretic.  Psychiatric:  He has a normal mood and affect. His behavior is normal. Judgment and thought content normal.          Assessment & Plan:

## 2015-03-01 NOTE — Patient Instructions (Signed)
Delsym OTC for cough if you need  4 days of the levaquin- if not helpful take the Nasocort.

## 2015-03-01 NOTE — Progress Notes (Signed)
Pre visit review using our clinic review tool, if applicable. No additional management support is needed unless otherwise documented below in the visit note. 

## 2015-03-05 NOTE — Assessment & Plan Note (Signed)
Probably seasonal allergic rhinitis. Pt does have some symptoms that point to viral URI. Pt states the Levaquin was most helpful. Will try 4 more days. Nasocort and delsym for the allergy portion. FU prn worsening/failure to improve.

## 2015-03-14 ENCOUNTER — Encounter: Payer: Self-pay | Admitting: Internal Medicine

## 2015-03-14 DIAGNOSIS — R05 Cough: Secondary | ICD-10-CM | POA: Diagnosis not present

## 2015-03-16 ENCOUNTER — Encounter: Payer: Self-pay | Admitting: Internal Medicine

## 2015-03-22 ENCOUNTER — Encounter: Payer: Self-pay | Admitting: Internal Medicine

## 2015-04-10 ENCOUNTER — Other Ambulatory Visit: Payer: Self-pay | Admitting: Internal Medicine

## 2015-04-16 ENCOUNTER — Encounter: Payer: Self-pay | Admitting: Internal Medicine

## 2015-04-16 ENCOUNTER — Ambulatory Visit (INDEPENDENT_AMBULATORY_CARE_PROVIDER_SITE_OTHER): Payer: Medicare Other | Admitting: Internal Medicine

## 2015-04-16 ENCOUNTER — Ambulatory Visit (INDEPENDENT_AMBULATORY_CARE_PROVIDER_SITE_OTHER)
Admission: RE | Admit: 2015-04-16 | Discharge: 2015-04-16 | Disposition: A | Discharge status: Home / Self Care | Payer: Medicare Other | Source: Ambulatory Visit | Attending: Internal Medicine | Admitting: Internal Medicine

## 2015-04-16 VITALS — BP 130/74 | HR 76 | Temp 98.0°F | Ht 73.0 in | Wt 222.2 lb

## 2015-04-16 DIAGNOSIS — H60391 Other infective otitis externa, right ear: Secondary | ICD-10-CM

## 2015-04-16 DIAGNOSIS — R05 Cough: Secondary | ICD-10-CM

## 2015-04-16 DIAGNOSIS — H60399 Other infective otitis externa, unspecified ear: Secondary | ICD-10-CM | POA: Insufficient documentation

## 2015-04-16 DIAGNOSIS — R059 Cough, unspecified: Secondary | ICD-10-CM | POA: Insufficient documentation

## 2015-04-16 MED ORDER — DOXYCYCLINE HYCLATE 100 MG PO TABS: 100.0000 mg | ORAL_TABLET | Freq: Two times a day (BID) | ORAL | Status: DC

## 2015-04-16 MED ORDER — CIPROFLOXACIN-DEXAMETHASONE 0.3-0.1 % OT SUSP: 4.0000 [drp] | Freq: Two times a day (BID) | OTIC | Status: DC

## 2015-04-16 NOTE — Progress Notes (Signed)
Pre visit review using our clinic review tool, if applicable. No additional management support is needed unless otherwise documented below in the visit note. 

## 2015-04-16 NOTE — Assessment & Plan Note (Signed)
Symptoms and exam c/w otitis externa. Start CiproDex. Follow up recheck in 1 week. If no improvement, discussed ENT referral.

## 2015-04-16 NOTE — Patient Instructions (Signed)
Start Doxycyline 100mg  twice daily.  Start Ciprodex to right ear 3-4 drops 2-3 times daily.  Follow up next week.

## 2015-04-16 NOTE — Progress Notes (Signed)
Subjective:    Patient ID: Richard Smith, male    DOB: [birth year], 69 y.o.   MRN: [id number]  HPI  69YO male presents for follow up.  Two months ago had a sinus infection. Took Levaquin for 6 days with minimal improvement. Then, was visiting [facility] and went to urgent care. Found to have right sided pneumonia. Treated with Levaquin and Prednisone. Felt better initially, but then cough has persisted. Last week, felt worse with fever 99.39F. Felt tired. Cough seemed to be worse. Cough is productive of green mucous at times. Occasional dyspnea with activity, described as mild. Continues to play soccer. Non smoker. Parents were smokers. No other recent travel.  Having some ear pain after removing hearing aid, right ear.   Past medical, surgical, family and social history per today's encounter.  Review of Systems  Constitutional: Positive for fever and fatigue. Negative for chills, activity change, appetite change and unexpected weight change.  HENT: Positive for ear discharge and ear pain. Negative for congestion, postnasal drip, rhinorrhea, sinus pressure, sneezing, sore throat, trouble swallowing and voice change.   Eyes: Negative for visual disturbance.  Respiratory: Positive for cough and shortness of breath. Negative for chest tightness and wheezing.   Cardiovascular: Negative for chest pain, palpitations and leg swelling.  Gastrointestinal: Negative for abdominal pain and abdominal distention.  Genitourinary: Negative for dysuria, urgency and difficulty urinating.  Musculoskeletal: Negative for arthralgias and gait problem.  Skin: Negative for color change and rash.  Hematological: Negative for adenopathy.  Psychiatric/Behavioral: Negative for sleep disturbance and dysphoric mood. The patient is not nervous/anxious.        Objective:    BP 130/74 mmHg  Pulse 76  Temp(Src) 98 F (36.7 C) (Oral)  Ht 6\' 1"  (1.854 m)  Wt 222 lb 4 oz (100.812 kg)  BMI 29.33 kg/m2  SpO2  94% Physical Exam  Constitutional: He is oriented to person, place, and time. He appears well-developed and well-nourished. No distress.  HENT:  Head: Normocephalic and atraumatic.  Right Ear: There is drainage.  Left Ear: External ear normal.  Nose: Nose normal.  Mouth/Throat: Oropharynx is clear and moist. No oropharyngeal exudate.  Right ear canal filled with purulent drainage. Left ear hearing aid in place.  Eyes: Conjunctivae and EOM are normal. Pupils are equal, round, and reactive to light. Right eye exhibits no discharge. Left eye exhibits no discharge. No scleral icterus.  Neck: Normal range of motion. Neck supple. No tracheal deviation present. No thyromegaly present.  Cardiovascular: Normal rate, regular rhythm and normal heart sounds.  Exam reveals no gallop and no friction rub.   No murmur heard. Pulmonary/Chest: Effort normal. No accessory muscle usage. No tachypnea. No respiratory distress. He has no decreased breath sounds. He has no wheezes. He has rhonchi in the right lower field. He has no rales. He exhibits no tenderness.  Musculoskeletal: Normal range of motion. He exhibits no edema.  Lymphadenopathy:    He has no cervical adenopathy.  Neurological: He is alert and oriented to person, place, and time. No cranial nerve deficit. Coordination normal.  Skin: Skin is warm and dry. No rash noted. He is not diaphoretic. No erythema. No pallor.  Psychiatric: He has a normal mood and affect. His behavior is normal. Judgment and thought content normal.          Assessment & Plan:   Problem List Items Addressed This Visit      Unprioritized   Cough - Primary    Persistent cough.  Exam remarkable for RLL rhonchi. Concerning for persistent pneumonia. CXR ordered. He has completed 2 courses of Levaquin. Question if he may have fungal or atypical infection. Discussed using Doxycycline and then potentially changing medication after CXR available. Follow up 1 week.       Relevant Medications   doxycycline (VIBRA-TABS) 100 MG tablet   Other Relevant Orders   DG Chest 2 View   Otitis, externa, infective    Symptoms and exam c/w otitis externa. Start CiproDex. Follow up recheck in 1 week. If no improvement, discussed ENT referral.      Relevant Medications   ciprofloxacin-dexamethasone (CIPRODEX) otic suspension       Return in about 1 week (around 04/23/2015) for Recheck.

## 2015-04-16 NOTE — Assessment & Plan Note (Addendum)
Persistent cough. Exam remarkable for RLL rhonchi. Concerning for persistent pneumonia. CXR ordered. He has completed 2 courses of Levaquin. Question if he may have fungal or atypical infection. Discussed using Doxycycline and then potentially changing medication after CXR available. Follow up 1 week.

## 2015-04-24 ENCOUNTER — Ambulatory Visit (INDEPENDENT_AMBULATORY_CARE_PROVIDER_SITE_OTHER): Payer: Medicare Other | Admitting: Internal Medicine

## 2015-04-24 ENCOUNTER — Encounter: Payer: Self-pay | Admitting: Internal Medicine

## 2015-04-24 ENCOUNTER — Telehealth: Payer: Self-pay

## 2015-04-24 VITALS — BP 113/72 | HR 78 | Temp 97.7°F | Ht 73.0 in | Wt 219.5 lb

## 2015-04-24 DIAGNOSIS — R059 Cough, unspecified: Secondary | ICD-10-CM

## 2015-04-24 DIAGNOSIS — R05 Cough: Secondary | ICD-10-CM

## 2015-04-24 NOTE — Telephone Encounter (Signed)
Letter mailed to pt.  

## 2015-04-24 NOTE — Progress Notes (Signed)
   Subjective:    Patient ID: BECKY BERBERIAN, male    DOB: 1946/01/07, 69 y.o.   MRN: [id number]  HPI  69YO male presents for follow up.  Seen 04/16/2015 for cough. CXR showed no acute infection. Treated empirically with Doxycycline.  Cough has improved. Doxycyline completes this Thursday. No dyspnea, chest pain, fever, chills. Plans to start back playing soccer tomorrow.  Past medical, surgical, family and social history per today's encounter.  Review of Systems  Constitutional: Negative for fever, chills, activity change and fatigue.  HENT: Negative for congestion, ear discharge, ear pain, hearing loss, nosebleeds, postnasal drip, rhinorrhea, sinus pressure, sneezing, sore throat, tinnitus, trouble swallowing and voice change.   Eyes: Negative for discharge, redness, itching and visual disturbance.  Respiratory: Positive for cough. Negative for chest tightness, shortness of breath, wheezing and stridor.   Cardiovascular: Negative for chest pain, palpitations and leg swelling.  Musculoskeletal: Negative for myalgias, arthralgias, neck pain and neck stiffness.  Skin: Negative for color change and rash.  Neurological: Negative for dizziness, facial asymmetry and headaches.  Psychiatric/Behavioral: Negative for sleep disturbance.       Objective:    BP 113/72 mmHg  Pulse 78  Temp(Src) 97.7 F (36.5 C) (Oral)  Ht 6\' 1"  (1.854 m)  Wt 219 lb 8 oz (99.565 kg)  BMI 28.97 kg/m2  SpO2 95% Physical Exam  Constitutional: He is oriented to person, place, and time. He appears well-developed and well-nourished. No distress.  HENT:  Head: Normocephalic and atraumatic.  Right Ear: External ear normal.  Left Ear: External ear normal.  Nose: Nose normal.  Mouth/Throat: Oropharynx is clear and moist. No oropharyngeal exudate.  Eyes: Conjunctivae and EOM are normal. Pupils are equal, round, and reactive to light. Right eye exhibits no discharge. Left eye exhibits no discharge. No scleral  icterus.  Neck: Normal range of motion. Neck supple. No tracheal deviation present. No thyromegaly present.  Cardiovascular: Normal rate, regular rhythm and normal heart sounds.  Exam reveals no gallop and no friction rub.   No murmur heard. Pulmonary/Chest: Effort normal and breath sounds normal. No accessory muscle usage. No tachypnea. No respiratory distress. He has no decreased breath sounds. He has no wheezes. He has no rhonchi. He has no rales.   He exhibits no tenderness.  Musculoskeletal: Normal range of motion. He exhibits no edema.  Lymphadenopathy:    He has no cervical adenopathy.  Neurological: He is alert and oriented to person, place, and time. No cranial nerve deficit. Coordination normal.  Skin: Skin is warm and dry. No rash noted. He is not diaphoretic. No erythema. No pallor.  Psychiatric: He has a normal mood and affect. His behavior is normal. Judgment and thought content normal.          Assessment & Plan:   Problem List Items Addressed This Visit      Unprioritized   Cough - Primary    Cough has improved with doxycycline. CXR was normal. Will continue to monitor. If persistent or recurrent symptoms, discussed referral to pulmonary for PFTs.          Return in about 6 months (around 10/25/2015) for Wellness Visit.

## 2015-04-24 NOTE — Telephone Encounter (Signed)
-----   Message from Irene Shipper, MD sent at 04/24/2015  2:07 PM EDT ----- Regarding: Overdue for follow-up. Vaughan Basta, this patient is 1 year overdue for office follow-up. Please send a letter. Thank you. Document

## 2015-04-24 NOTE — Progress Notes (Signed)
Pre visit review using our clinic review tool, if applicable. No additional management support is needed unless otherwise documented below in the visit note. 

## 2015-04-24 NOTE — Assessment & Plan Note (Signed)
Cough has improved with doxycycline. CXR was normal. Will continue to monitor. If persistent or recurrent symptoms, discussed referral to pulmonary for PFTs.

## 2015-04-24 NOTE — Patient Instructions (Signed)
Look for physician who uses Epic if possible.

## 2015-05-16 ENCOUNTER — Ambulatory Visit (INDEPENDENT_AMBULATORY_CARE_PROVIDER_SITE_OTHER): Payer: Medicare Other | Admitting: Psychology

## 2015-05-16 DIAGNOSIS — F4323 Adjustment disorder with mixed anxiety and depressed mood: Secondary | ICD-10-CM

## 2015-05-28 ENCOUNTER — Other Ambulatory Visit: Payer: Self-pay

## 2015-06-01 ENCOUNTER — Encounter: Payer: Self-pay | Admitting: Internal Medicine

## 2015-06-01 ENCOUNTER — Ambulatory Visit (INDEPENDENT_AMBULATORY_CARE_PROVIDER_SITE_OTHER): Payer: Medicare Other | Admitting: Internal Medicine

## 2015-06-01 VITALS — BP 110/72 | HR 78 | Ht 72.25 in | Wt 225.4 lb

## 2015-06-01 DIAGNOSIS — K2270 Barrett's esophagus without dysplasia: Secondary | ICD-10-CM | POA: Diagnosis not present

## 2015-06-01 DIAGNOSIS — K2100 Gastro-esophageal reflux disease with esophagitis, without bleeding: Secondary | ICD-10-CM

## 2015-06-01 NOTE — Patient Instructions (Signed)
You have been scheduled for an endoscopy. Please follow written instructions given to you at your visit today. If you use inhalers (even only as needed), please bring them with you on the day of your procedure.   

## 2015-06-01 NOTE — Progress Notes (Signed)
HISTORY OF PRESENT ILLNESS:  Richard Smith is a 69 y.o. male with past medical history as listed below who was initially evaluated 11/15/2014 for second opinion regarding recent endoscopic evaluations elsewhere. See that dictation for details. Patient has chronic GERD and has been on PPI therapy for 15+ years. He underwent colonoscopy and upper endoscopy in March 2015 at [facility]. Colonoscopy revealed left-sided diverticulosis and non-adenomatous polyps. Upper endoscopy revealed grade A esophagitis. The esophagus was said to be otherwise normal. However, biopsies were taken at the squamocolumnar junction and returned with reading of Barrett's esophagus. I have the slides reviewed here in [employer] with the same reading. At the time of the patient's visit he was having breakthrough symptoms on Nexium 40 mg daily. He was switched to Dexilant 60 mg daily. Asked to follow-up in 3 months but did not. Schedule for this appointment today. Patient reports Dexilant seem to be helpful initially. However he continues with breakthrough symptoms at least once per week, particularly at night. He does notice is with large meals or eating late. As well alcohol seems to be problematic. No dysphagia. No other complaints. He does tell me that he takes metoclopramide occasionally for breakthrough symptoms. This is quite infrequent.  REVIEW OF SYSTEMS:  All non-GI ROS negative except for back pain, sinus and allergy, cough, fatigue, ankle edema  Past Medical History  Diagnosis Date  . GERD (gastroesophageal reflux disease)   . Hyperlipidemia   . Barrett's esophagus   . Allergic rhinitis   . Colon polyps     hyperplastic  . Hiatal hernia   . Internal hemorrhoids   . Diverticulosis   . Pneumonia     Past Surgical History  Procedure Laterality Date  . Anterior cruciate ligament repair      Social History MAC DOWDELL  reports that he quit smoking about 33 years ago. His smoking use  included Cigarettes. He has a 15 pack-year smoking history. He has never used smokeless tobacco. He reports that he drinks alcohol. He reports that he does not use illicit drugs.  family history includes Heart disease in his mother.  Allergies  Allergen Reactions  . Lactose Intolerance (Gi)        PHYSICAL EXAMINATION: Vital signs: BP 110/72 mmHg  Pulse 78  Ht 6' 0.25" (1.835 m)  Wt 225 lb 6 oz (102.229 kg)  BMI 30.36 kg/m2 General: Well-developed, well-nourished, no acute distress HEENT: Sclerae are anicteric, conjunctiva pink. Oral mucosa intact Lungs: Clear Heart: Regular Abdomen: soft, nontender, nondistended, no obvious ascites, no peritoneal signs, normal bowel sounds. No organomegaly. Extremities: No clubbing cyanosis or edema Psychiatric: alert and oriented x3. Cooperative   ASSESSMENT:  #1. GERD. Breakthrough symptoms despite excellent 60 mg daily #2. Question Barrett's esophagus based on biopsies elsewhere. Reviewed here. Needs clarified #3. Colonoscopy March 2015 without neoplasia   PLAN:  #1. Continue Dexilant #2. Strict adherence to reflux precautions. Reviewed today #3. Recommended an antiacid over metoclopramide (reviewed potential neurologic side effects, thus prefer he did not use it) for breakthrough symptoms #4. Upper endoscopy with biopsies, if indicated, to clarify question of Barrett's.The nature of the procedure, as well as the risks, benefits, and alternatives were carefully and thoroughly reviewed with the patient. Ample time for discussion and questions allowed. The patient understood, was satisfied, and agreed to proceed. #5. Routine colonoscopy in 2025

## 2015-06-12 ENCOUNTER — Ambulatory Visit (INDEPENDENT_AMBULATORY_CARE_PROVIDER_SITE_OTHER): Payer: Medicare Other | Admitting: Psychology

## 2015-06-12 DIAGNOSIS — F4323 Adjustment disorder with mixed anxiety and depressed mood: Secondary | ICD-10-CM

## 2015-06-26 ENCOUNTER — Encounter: Payer: Self-pay | Admitting: Internal Medicine

## 2015-06-26 ENCOUNTER — Ambulatory Visit (INDEPENDENT_AMBULATORY_CARE_PROVIDER_SITE_OTHER): Payer: Medicare Other | Admitting: Internal Medicine

## 2015-06-26 ENCOUNTER — Ambulatory Visit (INDEPENDENT_AMBULATORY_CARE_PROVIDER_SITE_OTHER)
Admission: RE | Admit: 2015-06-26 | Discharge: 2015-06-26 | Disposition: A | Discharge status: Home / Self Care | Payer: Medicare Other | Source: Ambulatory Visit | Attending: Internal Medicine | Admitting: Internal Medicine

## 2015-06-26 VITALS — BP 113/68 | HR 68 | Temp 98.3°F | Ht 73.0 in | Wt 223.5 lb

## 2015-06-26 DIAGNOSIS — J209 Acute bronchitis, unspecified: Secondary | ICD-10-CM | POA: Insufficient documentation

## 2015-06-26 MED ORDER — DOXYCYCLINE HYCLATE 100 MG PO TABS: 100.0000 mg | ORAL_TABLET | Freq: Two times a day (BID) | ORAL | Status: DC

## 2015-06-26 MED ORDER — PREDNISONE 10 MG PO TABS: ORAL_TABLET | ORAL | Status: DC

## 2015-06-26 MED ORDER — HYDROCOD POLST-CPM POLST ER 10-8 MG/5ML PO SUER: 5.0000 mL | Freq: Two times a day (BID) | ORAL | Status: DC | PRN

## 2015-06-26 NOTE — Patient Instructions (Addendum)
Start Doxycycline 100mg  twice daily.  Start Prednisone taper.  Use Tussionex as needed for cough.  Chest xray today.  Follow up 2 weeks.

## 2015-06-26 NOTE — Progress Notes (Signed)
Pre visit review using our clinic review tool, if applicable. No additional management support is needed unless otherwise documented below in the visit note. 

## 2015-06-26 NOTE — Assessment & Plan Note (Signed)
Symptoms and exam consistent with early bronchitis. Recent h/o pneumonia and former smoker. Will get CXR. Start Doxycycline and Prednisone taper. Tussionex prn for cough. Follow up 2 weeks and prn.

## 2015-06-26 NOTE — Progress Notes (Signed)
   Subjective:    Patient ID: Richard Smith, male    DOB: 1946/03/17, 69 y.o.   MRN: [id number]  HPI  68YO male presents for acute visit.  Cough - Started yesterday. Cough productive of thick green sputum. Mild dyspnea. No chest pain. Energy decreased.No fever. Not taking anything for symptoms. Played soccer last week.   Past medical, surgical, family and social history per today's encounter.  Review of Systems  Constitutional: Positive for fatigue. Negative for fever, chills and activity change.  HENT: Negative for congestion, ear discharge, ear pain, hearing loss, nosebleeds, postnasal drip, rhinorrhea, sinus pressure, sneezing, sore throat, tinnitus, trouble swallowing and voice change.   Eyes: Negative for discharge, redness, itching and visual disturbance.  Respiratory: Positive for cough, chest tightness and shortness of breath. Negative for wheezing and stridor.   Cardiovascular: Negative for chest pain and leg swelling.  Musculoskeletal: Negative for myalgias, arthralgias, neck pain and neck stiffness.  Skin: Negative for color change and rash.  Neurological: Negative for dizziness, facial asymmetry and headaches.  Psychiatric/Behavioral: Negative for sleep disturbance.       Objective:    BP 113/68 mmHg  Pulse 68  Temp(Src) 98.3 F (36.8 C) (Oral)  Ht 6\' 1"  (1.854 m)  Wt 223 lb 8 oz (101.379 kg)  BMI 29.49 kg/m2  SpO2 96% Physical Exam  Constitutional: He is oriented to person, place, and time. He appears well-developed and well-nourished. No distress.  HENT:  Head: Normocephalic and atraumatic.  Right Ear: External ear normal.  Left Ear: External ear normal.  Nose: Nose normal.  Mouth/Throat: Oropharynx is clear and moist. No oropharyngeal exudate.  Eyes: Conjunctivae and EOM are normal. Pupils are equal, round, and reactive to light. Right eye exhibits no discharge. Left eye exhibits no discharge. No scleral icterus.  Neck: Normal range of motion. Neck  supple. No tracheal deviation present. No thyromegaly present.  Cardiovascular: Normal rate, regular rhythm and normal heart sounds.  Exam reveals no gallop and no friction rub.   No murmur heard. Pulmonary/Chest: Effort normal. No accessory muscle usage. No tachypnea. No respiratory distress. He has decreased breath sounds. He has no wheezes. He has rhonchi (scattered). He has no rales. He exhibits no tenderness.  Musculoskeletal: Normal range of motion. He exhibits no edema.  Lymphadenopathy:    He has no cervical adenopathy.  Neurological: He is alert and oriented to person, place, and time. No cranial nerve deficit. Coordination normal.  Skin: Skin is warm and dry. No rash noted. He is not diaphoretic. No erythema. No pallor.  Psychiatric: He has a normal mood and affect. His behavior is normal. Judgment and thought content normal.          Assessment & Plan:   Problem List Items Addressed This Visit      Unprioritized   Acute bronchitis - Primary    Symptoms and exam consistent with early bronchitis. Recent h/o pneumonia and former smoker. Will get CXR. Start Doxycycline and Prednisone taper. Tussionex prn for cough. Follow up 2 weeks and prn.      Relevant Medications   predniSONE (DELTASONE) 10 MG tablet   doxycycline (VIBRA-TABS) 100 MG tablet   chlorpheniramine-HYDROcodone (TUSSIONEX PENNKINETIC ER) 10-8 MG/5ML SUER   Other Relevant Orders   DG Chest 2 View       Return in about 2 weeks (around 07/10/2015) for Recheck.

## 2015-07-10 ENCOUNTER — Ambulatory Visit (INDEPENDENT_AMBULATORY_CARE_PROVIDER_SITE_OTHER): Payer: Medicare Other | Admitting: Internal Medicine

## 2015-07-10 ENCOUNTER — Encounter: Payer: Self-pay | Admitting: Internal Medicine

## 2015-07-10 VITALS — BP 108/62 | HR 86 | Temp 98.4°F | Wt 221.0 lb

## 2015-07-10 DIAGNOSIS — J209 Acute bronchitis, unspecified: Secondary | ICD-10-CM | POA: Diagnosis not present

## 2015-07-10 MED ORDER — AZITHROMYCIN 250 MG PO TABS: ORAL_TABLET | ORAL | Status: DC

## 2015-07-10 NOTE — Assessment & Plan Note (Signed)
Persistent symptoms of cough, however fatigue and fever improved. Most likely symptoms related to viral infection and cough will take 4-5 weeks to completely resolved. However given multiple recurrent courses of bronchitis, question if he may have atypical infection. Will add course of Azithromycin. Sputum culture. Follow up by email later this week. Discussed pulmonary evaluation if symptoms not improving.

## 2015-07-10 NOTE — Patient Instructions (Addendum)
Start Azithromycin.  If cough is persistent, please submit a sputum culture.  Email or call near end of week with update.

## 2015-07-10 NOTE — Progress Notes (Signed)
Subjective:    Patient ID: Richard Smith, male    DOB: [birth year], 69 y.o.   MRN: [id number]  HPI  69YO male presents for follow up.  Last seen 7/26 for acute bronchitis. Treated with Prednisone and Doxycycline. CXR was normal.  Continues to have cough productive of green mucous. Mostly in the mornings. No fever or chills. No dyspnea. Energy level improved. Played soccer last week. No recent travel. Non-smoker. No known sick contacts. He cares for multiple animals at home including hummingbirds.  Past medical, surgical, family and social history per today's encounter.   Review of Systems  Constitutional: Negative for fever, chills, activity change, appetite change, fatigue and unexpected weight change.  HENT: Negative for congestion, ear discharge, ear pain, hearing loss, nosebleeds, postnasal drip, rhinorrhea, sinus pressure, sneezing, sore throat, tinnitus, trouble swallowing and voice change.   Eyes: Negative for discharge, redness, itching and visual disturbance.  Respiratory: Positive for cough. Negative for chest tightness, shortness of breath, wheezing and stridor.   Cardiovascular: Negative for chest pain, palpitations and leg swelling.  Gastrointestinal: Negative for abdominal pain and abdominal distention.  Genitourinary: Negative for dysuria, urgency and difficulty urinating.  Musculoskeletal: Negative for myalgias, arthralgias, gait problem, neck pain and neck stiffness.  Skin: Negative for color change and rash.  Neurological: Negative for dizziness, facial asymmetry and headaches.  Hematological: Negative for adenopathy.  Psychiatric/Behavioral: Negative for sleep disturbance and dysphoric mood. The patient is not nervous/anxious.        Objective:    BP 108/62 mmHg  Pulse 86  Temp(Src) 98.4 F (36.9 C) (Oral)  Wt 221 lb (100.245 kg)  SpO2 98% Physical Exam  Constitutional: He is oriented to person, place, and time. He appears well-developed and  well-nourished. No distress.  HENT:  Head: Normocephalic and atraumatic.  Right Ear: External ear normal.  Left Ear: External ear normal.  Nose: Nose normal.  Mouth/Throat: Oropharynx is clear and moist. No oropharyngeal exudate.  Eyes: Conjunctivae and EOM are normal. Pupils are equal, round, and reactive to light. Right eye exhibits no discharge. Left eye exhibits no discharge. No scleral icterus.  Neck: Normal range of motion. Neck supple. No tracheal deviation present. No thyromegaly present.  Cardiovascular: Normal rate, regular rhythm and normal heart sounds.  Exam reveals no gallop and no friction rub.   No murmur heard. Pulmonary/Chest: Effort normal. No accessory muscle usage. No tachypnea. No respiratory distress. He has no decreased breath sounds. He has no wheezes. He has rhonchi (scattered crackles). He has no rales. He exhibits no tenderness.  Musculoskeletal: Normal range of motion. He exhibits no edema.  Lymphadenopathy:    He has no cervical adenopathy.  Neurological: He is alert and oriented to person, place, and time. No cranial nerve deficit. Coordination normal.  Skin: Skin is warm and dry. No rash noted. He is not diaphoretic. No erythema. No pallor.  Psychiatric: He has a normal mood and affect. His behavior is normal. Judgment and thought content normal.          Assessment & Plan:   Problem List Items Addressed This Visit      Unprioritized   Acute bronchitis - Primary    Persistent symptoms of cough, however fatigue and fever improved. Most likely symptoms related to viral infection and cough will take 4-5 weeks to completely resolved. However given multiple recurrent courses of bronchitis, question if he may have atypical infection. Will add course of Azithromycin. Sputum culture. Follow up by email later this week.  Discussed pulmonary evaluation if symptoms not improving.      Relevant Medications   azithromycin (ZITHROMAX Z-PAK) 250 MG tablet   Other  Relevant Orders   Sputum culture       Return in about 4 weeks (around 08/07/2015) for Recheck.

## 2015-07-10 NOTE — Progress Notes (Signed)
Pre visit review using our clinic review tool, if applicable. No additional management support is needed unless otherwise documented below in the visit note. 

## 2015-07-13 ENCOUNTER — Encounter: Payer: Self-pay | Admitting: Internal Medicine

## 2015-07-16 ENCOUNTER — Encounter: Payer: Self-pay | Admitting: Internal Medicine

## 2015-07-17 ENCOUNTER — Encounter: Payer: Self-pay | Admitting: Internal Medicine

## 2015-07-17 DIAGNOSIS — R05 Cough: Secondary | ICD-10-CM

## 2015-07-17 DIAGNOSIS — R053 Chronic cough: Secondary | ICD-10-CM

## 2015-07-19 ENCOUNTER — Ambulatory Visit (INDEPENDENT_AMBULATORY_CARE_PROVIDER_SITE_OTHER): Payer: Medicare Other | Admitting: Pulmonary Disease

## 2015-07-19 ENCOUNTER — Encounter: Payer: Self-pay | Admitting: Pulmonary Disease

## 2015-07-19 VITALS — BP 126/72 | HR 79 | Ht 72.75 in | Wt 223.0 lb

## 2015-07-19 DIAGNOSIS — K2100 Gastro-esophageal reflux disease with esophagitis, without bleeding: Secondary | ICD-10-CM

## 2015-07-19 DIAGNOSIS — R05 Cough: Secondary | ICD-10-CM

## 2015-07-19 DIAGNOSIS — R059 Cough, unspecified: Secondary | ICD-10-CM

## 2015-07-19 NOTE — Assessment & Plan Note (Signed)
His cough has slightly improved since taking the azithromycin and he says he feels like it's getting better. Fortunately he has no other respiratory complaints in that he has no shortness of breath, chest tightness or significant chest congestion. His mucus production is slowing down significantly. I have personally reviewed the images from his chest x-ray which showed an elevated right hemidiaphragm. Apart from that there is no evidence of significant lung disease. The significance of the right elevated hemidiaphragm is uncertain, but it does not appear that he has pneumonia associated with it which be the most devastating consequence of that problem.  I believe that his acid reflux is contracting significantly to the persistence of the cough. Even that he smoked for a long time he is not wheezing on exam today so I doubt airways disease.  Much of his cough is likely related to the normal natural history of an upper respiratory viral infection. He also has laryngeal irritation which is contributing to cyclical cough.  Plan: I recommended voice rest Acid reflux lifestyle modification and/or and acid therapy Spirometry today to look for COPD given the persistence of cough and symptoms this year If no improvement in 2-4 weeks and consider full pulmonary function testing and/or further imaging However, if symptoms resolve within 4 weeks then no further imaging or workup is necessary

## 2015-07-19 NOTE — Patient Instructions (Signed)
Follow the acid reflux lifestyle modification we gave you If your acid reflux is not better controlled and start taking Pepcid over-the-counter twice a day  You need to try to suppress your cough to allow your larynx (voice box) to heal.  For three days don't talk, laugh, sing, or clear your throat. Do everything you can to suppress the cough during this time. Use hard candies (sugarless Jolly Ranchers) or non-mint or non-menthol containing cough drops during this time to soothe your throat.  Use a cough suppressant (Delsym or what I have prescribed you) around the clock during this time.  After three days, gradually increase the use of your voice and back off on the cough suppressants.  Take Delsym twice a day for the cough  If you are not better and come back and see [place] in 4 weeks otherwise is okay to cancel the appointment

## 2015-07-19 NOTE — Assessment & Plan Note (Signed)
This is contributing significantly to his cough. We explained today the rationale behind dietary changes, elevating the head of his bed, and timing of food intake to try to control gastroesophageal reflux. I advised that he follow these recommendations and if no improvement start taking Pepcid over-the-counter twice a day

## 2015-07-19 NOTE — Progress Notes (Signed)
Subjective:    Patient ID: Richard Smith, male    DOB: November 30, 1946, 69 y.o.   MRN: [id number]  HPI Chief Complaint  Patient presents with  . Advice Only    Referred by Dr. Gilford Smith for chronic cough X25 days.     Richard Smith has been coughing now for 25 days. He says it started with sinus congestion and green sputum production. He was seen right away by Dr. Gilford Smith and treated with prednisone and an antibiotic.  This didn't help much so he went back and got a zpack.  He says this helped and the mucus is now clear.  However he still has the cough and has a fair amount of sputum.  He denies sick contacts that he knows of.  He is not short of breath, no fever or chills.  He had pneumonia in April 2016 and says he was sick for 2 months.  He had symptoms for 2 months.    He has never had any respiratory problems.  He used to smoked from age 60 through 48, 1 pdd.  Despite that he has not really had much in the way of symptoms until these episodes thie year.  Now no sinus problems.  Still has acid reflux but no worse.  He says he coughs day and night.  Now mostly dry but still has some clear mucus production.  Eating doesn't affect it.  Talking makes him cough less.  Lying flat doesn't make a difference.  Inside is worse.    Past Medical History  Diagnosis Date  . GERD (gastroesophageal reflux disease)   . Hyperlipidemia   . Barrett's esophagus   . Allergic rhinitis   . Colon polyps     hyperplastic  . Hiatal hernia   . Internal hemorrhoids   . Diverticulosis   . Pneumonia      No family history on file.   Social History   Social History  . Marital Status: Married    Spouse Name: N/A  . Number of Children: 2  . Years of Education: N/A   Occupational History  . Retired    Social History Main Topics  . Smoking status: Former Smoker -- 1.00 packs/day for 15 years    Types: Cigarettes    Quit date: 08/25/1981  . Smokeless tobacco: Never Used  . Alcohol Use: 0.0 oz/week    0  Standard drinks or equivalent per week     Comment: daily  . Drug Use: No  . Sexual Activity: Not on file   Other Topics Concern  . Not on file   Social History Narrative     Allergies  Allergen Reactions  . Lactose Intolerance (Gi)      Outpatient Prescriptions Prior to Visit  Medication Sig Dispense Refill  . celecoxib (CELEBREX) 200 MG capsule TAKE 1 CAPSULE (200 MG TOTAL) BY MOUTH DAILY. 30 capsule 2  . clotrimazole-betamethasone (LOTRISONE) cream Apply topically 2 (two) times daily. 45 g 3  . dexlansoprazole (DEXILANT) 60 MG capsule Take 1 capsule (60 mg total) by mouth daily. 30 capsule 11  . Loratadine (CLARITIN) 10 MG CAPS Take 1 capsule by mouth daily as needed.     . metoCLOPramide (REGLAN) 10 MG tablet TAKE 1 TABLET BY MOUTH 3 TIMES DAILY AS NEEDED 90 tablet 0  . Omega-3 Fatty Acids (FISH OIL) 500 MG CAPS Take by mouth daily.    Marland Kitchen triamcinolone (NASACORT AQ) 55 MCG/ACT AERO nasal inhaler Place 2 sprays into the nose  daily. (Patient taking differently: Place 2 sprays into the nose daily as needed. ) 1 Inhaler 12  . triamcinolone cream (KENALOG) 0.1 % Apply 1 application topically 2 (two) times daily. (Patient taking differently: Apply 1 application topically 2 (two) times daily as needed. ) 30 g 3  . azithromycin (ZITHROMAX Z-PAK) 250 MG tablet Take 2 pills day 1, then 1 pill daily days 2-5 (Patient not taking: Reported on 07/19/2015) 6 tablet 0  . chlorpheniramine-HYDROcodone (TUSSIONEX PENNKINETIC ER) 10-8 MG/5ML SUER Take 5 mLs by mouth every 12 (twelve) hours as needed for cough. (Patient not taking: Reported on 07/19/2015) 140 mL 0   No facility-administered medications prior to visit.       Review of Systems  Constitutional: Negative for fever and unexpected weight change.  HENT: Positive for congestion. Negative for dental problem, ear pain, nosebleeds, postnasal drip, rhinorrhea, sinus pressure, sneezing, sore throat and trouble swallowing.   Eyes: Negative for  redness and itching.  Respiratory: Positive for cough, chest tightness and shortness of breath. Negative for wheezing.   Cardiovascular: Negative for palpitations and leg swelling.  Gastrointestinal: Negative for nausea and vomiting.  Genitourinary: Negative for dysuria.  Musculoskeletal: Negative for joint swelling.  Skin: Negative for rash.  Neurological: Negative for headaches.  Hematological: Does not bruise/bleed easily.  Psychiatric/Behavioral: Negative for dysphoric mood. The patient is not nervous/anxious.        Objective:   Physical Exam Filed Vitals:   07/19/15 1422  BP: 126/72  Pulse: 79  Height: 6' 0.75" (1.848 m)  Weight: 223 lb (101.152 kg)  SpO2: 96%   RA  Gen: well appearing, no acute distress HENT: NCAT, OP clear, neck supple without masses Eyes: PERRL, EOMi Lymph: no cervical lymphadenopathy PULM: CTA B, diaphragmatic excursion less on R than L by 8-9 cm CV: RRR, no mgr, no JVD GI: BS+, soft, nontender, no hsm Derm: no rash or skin breakdown MSK: normal bulk and tone Neuro: A&Ox4, CN II-XII intact, strength 5/5 in all 4 extremities Psyche: normal mood and affect  July 2016 chest x-ray images personally reviewed showing a right elevated hemidiaphragm and possibly mild atelectasis Records from primary care physician's office reviewed where he was treated with azithromycin and a referral was made to [place].      Assessment & Plan:  GERD This is contributing significantly to his cough. We explained today the rationale behind dietary changes, elevating the head of his bed, and timing of food intake to try to control gastroesophageal reflux. I advised that he follow these recommendations and if no improvement start taking Pepcid over-the-counter twice a day  Cough His cough has slightly improved since taking the azithromycin and he says he feels like it's getting better. Fortunately he has no other respiratory complaints in that he has no shortness of breath,  chest tightness or significant chest congestion. His mucus production is slowing down significantly. I have personally reviewed the images from his chest x-ray which showed an elevated right hemidiaphragm. Apart from that there is no evidence of significant lung disease. The significance of the right elevated hemidiaphragm is uncertain, but it does not appear that he has pneumonia associated with it which be the most devastating consequence of that problem.  I believe that his acid reflux is contracting significantly to the persistence of the cough. Even that he smoked for a long time he is not wheezing on exam today so I doubt airways disease.  Much of his cough is likely related to the normal  natural history of an upper respiratory viral infection. He also has laryngeal irritation which is contributing to cyclical cough.  Plan: I recommended voice rest Acid reflux lifestyle modification and/or and acid therapy Spirometry today to look for COPD given the persistence of cough and symptoms this year If no improvement in 2-4 weeks and consider full pulmonary function testing and/or further imaging However, if symptoms resolve within 4 weeks then no further imaging or workup is necessary     Current outpatient prescriptions:  .  celecoxib (CELEBREX) 200 MG capsule, TAKE 1 CAPSULE (200 MG TOTAL) BY MOUTH DAILY., Disp: 30 capsule, Rfl: 2 .  clotrimazole-betamethasone (LOTRISONE) cream, Apply topically 2 (two) times daily., Disp: 45 g, Rfl: 3 .  dexlansoprazole (DEXILANT) 60 MG capsule, Take 1 capsule (60 mg total) by mouth daily., Disp: 30 capsule, Rfl: 11 .  Loratadine (CLARITIN) 10 MG CAPS, Take 1 capsule by mouth daily as needed. , Disp: , Rfl:  .  metoCLOPramide (REGLAN) 10 MG tablet, TAKE 1 TABLET BY MOUTH 3 TIMES DAILY AS NEEDED, Disp: 90 tablet, Rfl: 0 .  Omega-3 Fatty Acids (FISH OIL) 500 MG CAPS, Take by mouth daily., Disp: , Rfl:  .  triamcinolone (NASACORT AQ) 55 MCG/ACT AERO nasal  inhaler, Place 2 sprays into the nose daily. (Patient taking differently: Place 2 sprays into the nose daily as needed. ), Disp: 1 Inhaler, Rfl: 12 .  triamcinolone cream (KENALOG) 0.1 %, Apply 1 application topically 2 (two) times daily. (Patient taking differently: Apply 1 application topically 2 (two) times daily as needed. ), Disp: 30 g, Rfl: 3

## 2015-07-31 ENCOUNTER — Institutional Professional Consult (permissible substitution): Payer: Self-pay | Admitting: Pulmonary Disease

## 2015-08-08 ENCOUNTER — Encounter: Payer: Self-pay | Admitting: Internal Medicine

## 2015-08-13 ENCOUNTER — Ambulatory Visit (AMBULATORY_SURGERY_CENTER): Payer: Medicare Other | Admitting: Internal Medicine

## 2015-08-13 ENCOUNTER — Encounter: Payer: Self-pay | Admitting: Internal Medicine

## 2015-08-13 VITALS — BP 142/84 | HR 63 | Temp 97.0°F | Resp 16 | Ht 72.25 in | Wt 225.0 lb

## 2015-08-13 DIAGNOSIS — K449 Diaphragmatic hernia without obstruction or gangrene: Secondary | ICD-10-CM | POA: Diagnosis not present

## 2015-08-13 DIAGNOSIS — K3189 Other diseases of stomach and duodenum: Secondary | ICD-10-CM | POA: Diagnosis not present

## 2015-08-13 DIAGNOSIS — K2270 Barrett's esophagus without dysplasia: Secondary | ICD-10-CM

## 2015-08-13 DIAGNOSIS — K219 Gastro-esophageal reflux disease without esophagitis: Secondary | ICD-10-CM

## 2015-08-13 DIAGNOSIS — K2950 Unspecified chronic gastritis without bleeding: Secondary | ICD-10-CM | POA: Diagnosis not present

## 2015-08-13 MED ORDER — SODIUM CHLORIDE 0.9 % IV SOLN: 500.0000 mL | INTRAVENOUS | Status: DC

## 2015-08-13 NOTE — Progress Notes (Signed)
Called to room to assist during endoscopic procedure.  Patient ID and intended procedure confirmed with present staff. Received instructions for my participation in the procedure from the performing physician.  

## 2015-08-13 NOTE — Progress Notes (Signed)
To recovery, report to Brown, RN, VSS. 

## 2015-08-13 NOTE — Patient Instructions (Signed)
YOU HAD AN ENDOSCOPIC PROCEDURE TODAY AT [facility]:   Refer to the procedure report that was given to you for any specific questions about what was found during the examination.  If the procedure report does not answer your questions, please call your gastroenterologist to clarify.  If you requested that your care partner not be given the details of your procedure findings, then the procedure report has been included in a sealed envelope for you to review at your convenience later.  YOU SHOULD EXPECT: Some feelings of bloating in the abdomen. Passage of more gas than usual.  Walking can help get rid of the air that was put into your GI tract during the procedure and reduce the bloating. If you had a lower endoscopy (such as a colonoscopy or flexible sigmoidoscopy) you may notice spotting of blood in your stool or on the toilet paper. If you underwent a bowel prep for your procedure, you may not have a normal bowel movement for a few days.  Please Note:  You might notice some irritation and congestion in your nose or some drainage.  This is from the oxygen used during your procedure.  There is no need for concern and it should clear up in a day or so.  SYMPTOMS TO REPORT IMMEDIATELY:     Following upper endoscopy (EGD)  Vomiting of blood or coffee ground material  New chest pain or pain under the shoulder blades  Painful or persistently difficult swallowing  New shortness of breath  Fever of 100F or higher  Black, tarry-looking stools  For urgent or emergent issues, a gastroenterologist can be reached at any hour by calling [phone].   DIET: Your first meal following the procedure should be a small meal and then it is ok to progress to your normal diet. Heavy or fried foods are harder to digest and may make you feel nauseous or bloated.  Likewise, meals heavy in dairy and vegetables can increase bloating.  Drink plenty of fluids but you should avoid alcoholic beverages  for 24 hours.  ACTIVITY:  You should plan to take it easy for the rest of today and you should NOT DRIVE or use heavy machinery until tomorrow (because of the sedation medicines used during the test).    FOLLOW UP: Our staff will call the number listed on your records the next business day following your procedure to check on you and address any questions or concerns that you may have regarding the information given to you following your procedure. If we do not reach you, we will leave a message.  However, if you are feeling well and you are not experiencing any problems, there is no need to return our call.  We will assume that you have returned to your regular daily activities without incident.  If any biopsies were taken you will be contacted by phone or by letter within the next 1-3 weeks.  Please call us at [phone] if you have not heard about the biopsies in 3 weeks.    SIGNATURES/CONFIDENTIALITY: You and/or your care partner have signed paperwork which will be entered into your electronic medical record.  These signatures attest to the fact that that the information above on your After Visit Summary has been reviewed and is understood.  Full responsibility of the confidentiality of this discharge information lies with you and/or your care-partner.    Resume medications. Routine office follow up in one year. Information given on Reflux.

## 2015-08-13 NOTE — Op Note (Signed)
[facility]  [address] [place], [address]   ENDOSCOPY PROCEDURE REPORT  PATIENT: Richard Smith, Richard Smith  MR#: [id number] BIRTHDATE: [birth year] , 68  yrs. old GENDER: male ENDOSCOPIST: Eustace Quail, MD REFERRED BY:  .  Self / Office PROCEDURE DATE:  08/13/2015 PROCEDURE:  EGD w/ biopsy ASA CLASS:     Class II INDICATIONS:  history of Barrett's esophagus. ? Based on endoscopic report MEDICATIONS: Monitored anesthesia care and Propofol 230 mg IV TOPICAL ANESTHETIC: none  DESCRIPTION OF PROCEDURE: After the risks benefits and alternatives of the procedure were thoroughly explained, informed consent was obtained.  The LB [id number] endoscope was introduced through the mouth and advanced to the second portion of the duodenum , Without limitations.  The instrument was slowly withdrawn as the mucosa was fully examined.  EXAM:The esophagus was normal throughout its entirety.  The mucosal Z line was in the proper anatomic position.  Proximal gastric folds abutted the squamous columnar junction.  Was a small sliding hiatal hernia.  Within the hernia was a 4 mm benign-appearing nodule which was biopsied.  Remainder of the stomach was normal.  The duodenum was normal.  Retroflexed views revealed a hiatal hernia.     The scope was then withdrawn from the patient and the procedure completed.  COMPLICATIONS: There were no immediate complications.  ENDOSCOPIC IMPRESSION: 1. No endoscopic evidence of Barrett's esophagus by definition 2. GERD 3. Incidental benign-appearing proximal gastric nodule  RECOMMENDATIONS: 1.  Anti-reflux regimen to be followed 2.  Continue PPI 3.  Await biopsy results of nodule 4. Routine office follow-up in 1 year  REPEAT EXAM:  eSigned:  Eustace Quail, MD 08/13/2015 12:01 PM    CC:The Patient and Ronette Deter MD

## 2015-08-14 ENCOUNTER — Telehealth: Payer: Self-pay | Admitting: *Deleted

## 2015-08-14 NOTE — Telephone Encounter (Signed)
  Follow up Call-  Call back number 08/13/2015  Post procedure Call Back phone  # [phone]  Permission to leave phone message Yes     Patient questions:  Do you have a fever, pain , or abdominal swelling? No. Pain Score  0 *  Have you tolerated food without any problems? Yes.    Have you been able to return to your normal activities? Yes.    Do you have any questions about your discharge instructions: Diet   No. Medications  No. Follow up visit  No.  Do you have questions or concerns about your Care? No.  Actions: * If pain score is 4 or above: No action needed, pain <4.

## 2015-08-16 ENCOUNTER — Ambulatory Visit: Payer: Self-pay | Admitting: Pulmonary Disease

## 2015-08-21 ENCOUNTER — Encounter: Payer: Self-pay | Admitting: Internal Medicine

## 2015-11-01 ENCOUNTER — Encounter: Payer: Self-pay | Admitting: Pulmonary Disease

## 2015-11-01 ENCOUNTER — Ambulatory Visit (INDEPENDENT_AMBULATORY_CARE_PROVIDER_SITE_OTHER): Payer: Medicare Other | Admitting: Pulmonary Disease

## 2015-11-01 VITALS — BP 136/76 | HR 87 | Ht 72.75 in | Wt 223.0 lb

## 2015-11-01 DIAGNOSIS — J309 Allergic rhinitis, unspecified: Secondary | ICD-10-CM | POA: Insufficient documentation

## 2015-11-01 DIAGNOSIS — R05 Cough: Secondary | ICD-10-CM | POA: Diagnosis not present

## 2015-11-01 DIAGNOSIS — R059 Cough, unspecified: Secondary | ICD-10-CM

## 2015-11-01 DIAGNOSIS — J3089 Other allergic rhinitis: Secondary | ICD-10-CM | POA: Diagnosis not present

## 2015-11-01 DIAGNOSIS — K219 Gastro-esophageal reflux disease without esophagitis: Secondary | ICD-10-CM | POA: Diagnosis not present

## 2015-11-01 MED ORDER — BENZONATATE 200 MG PO CAPS: 200.0000 mg | ORAL_CAPSULE | Freq: Three times a day (TID) | ORAL | Status: DC | PRN
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
## 2015-11-01 NOTE — Patient Instructions (Signed)
For the sinus problem: Use Neil Med rinses with distilled water at least twice per day using the instructions on the package. 1/2 hour after using the [facility] Med rinse, use Nasacort two puffs in each nostril once per day.  Remember that the Nasacort can take 1-2 weeks to work after regular use. Use generic zyrtec (cetirizine) every day.  If this doesn't help, then stop taking it and use chlorpheniramine-phenylephrine combination tablets.  For the acid reflux: Keep taking your antacid medicine  For the cough You need to try to suppress your cough to allow your larynx (voice box) to heal.  For three days don't talk, laugh, sing, or clear your throat. Do everything you can to suppress the cough during this time. Use hard candies (sugarless Jolly Ranchers) or non-mint or non-menthol containing cough drops during this time to soothe your throat.  Use a cough suppressant (Delsym or what I have prescribed you) around the clock during this time.  After three days, gradually increase the use of your voice and back off on the cough suppressants.  If you have not improved in 6-8 weeks let me know; we will see you back in ~8 weeks

## 2015-11-01 NOTE — Progress Notes (Signed)
Subjective:    Patient ID: Richard Smith, male    DOB: 03-Sep-1946, 69 y.o.   MRN: [id number]  Synopsis: Referred in August 2016 for chronic cough Simple spirometry at that time showed a ratio of 81%, FEV1 2.59 L (68% predicted)   HPI Chief Complaint  Patient presents with  . Follow-up    cough is unchanged.  prod cough in mornings, goes nonprod as the day progresses.      Even though his cough had initially improved after the last visit he has been experiencing a persistant cough for the last several months. He says he is intolerant of anything in his throat or on his vocal cords which didn't help. He is frustrated by the persistence of his cough. He continues to have sinus problems and has had this for years.  Specifically he has post nasal drip, sniffling.  Paraoxically he tells me that he does and then that he does not have post nasal drip in the same one minute period. He does not use the nasacort.     Past Medical History  Diagnosis Date  . GERD (gastroesophageal reflux disease)   . Hyperlipidemia   . Barrett's esophagus   . Allergic rhinitis   . Colon polyps     hyperplastic  . Hiatal hernia   . Internal hemorrhoids   . Diverticulosis   . Pneumonia       Review of Systems  Constitutional: Positive for fatigue. Negative for fever and chills.  HENT: Negative for postnasal drip, rhinorrhea and sinus pressure.   Respiratory: Positive for cough. Negative for shortness of breath and wheezing.   Cardiovascular: Negative for chest pain, palpitations and leg swelling.       Objective:   Physical Exam Filed Vitals:   11/01/15 1418  BP: 136/76  Pulse: 87  Height: 6' 0.75" (1.848 m)  Weight: 223 lb (101.152 kg)  SpO2: 95%   RA  Gen: well appearing HENT: OP clear, TM's clear, neck supple PULM: CTA B, normal percussion CV: RRR, no mgr, trace edema GI: BS+, soft, nontender Derm: no cyanosis or rash Psyche: normal mood and affect  CXR images from earlier  this year and 2014 reviewed again> right hemidiaphragm elevation chronic, no infiltrate Spirometry reviewed again> no airflow obstruction        Assessment & Plan:  No problem-specific assessment & plan notes found for this encounter.    Current outpatient prescriptions:  .  celecoxib (CELEBREX) 200 MG capsule, TAKE 1 CAPSULE (200 MG TOTAL) BY MOUTH DAILY., Disp: 30 capsule, Rfl: 2 .  clotrimazole-betamethasone (LOTRISONE) cream, Apply topically 2 (two) times daily., Disp: 45 g, Rfl: 3 .  dexlansoprazole (DEXILANT) 60 MG capsule, Take 1 capsule (60 mg total) by mouth daily., Disp: 30 capsule, Rfl: 11 .  Loratadine (CLARITIN) 10 MG CAPS, Take 1 capsule by mouth daily as needed. , Disp: , Rfl:  .  metoCLOPramide (REGLAN) 10 MG tablet, TAKE 1 TABLET BY MOUTH 3 TIMES DAILY AS NEEDED, Disp: 90 tablet, Rfl: 0 .  Omega-3 Fatty Acids (FISH OIL) 500 MG CAPS, Take by mouth daily., Disp: , Rfl:  .  triamcinolone (NASACORT AQ) 55 MCG/ACT AERO nasal inhaler, Place 2 sprays into the nose daily. (Patient taking differently: Place 2 sprays into the nose daily as needed. ), Disp: 1 Inhaler, Rfl: 12 .  triamcinolone cream (KENALOG) 0.1 %, Apply 1 application topically 2 (two) times daily. (Patient taking differently: Apply 1 application topically 2 (two) times daily as needed. ),  Disp: 30 g, Rfl: 3

## 2015-11-01 NOTE — Assessment & Plan Note (Signed)
This problem has been worse recently and is currently untreated. It is contributing to the significance of his cough. He is not taking Nasacort appropriately and he is not taking any medication for it at this time.  Plan: Richard Smith med rinses Nasacort, I briefly explained the pharmacology and appropriate use of this medication today Cetirizine

## 2015-11-01 NOTE — Assessment & Plan Note (Signed)
This is contributing to his cough. He needs to remain compliant with his proton pump inhibitor.

## 2015-11-25 ENCOUNTER — Other Ambulatory Visit: Payer: Self-pay | Admitting: Internal Medicine

## 2015-12-10 ENCOUNTER — Other Ambulatory Visit: Payer: Self-pay | Admitting: Internal Medicine

## 2015-12-31 ENCOUNTER — Encounter: Payer: Self-pay | Admitting: Internal Medicine

## 2016-01-02 DIAGNOSIS — X32XXXA Exposure to sunlight, initial encounter: Secondary | ICD-10-CM | POA: Diagnosis not present

## 2016-01-02 DIAGNOSIS — D225 Melanocytic nevi of trunk: Secondary | ICD-10-CM | POA: Diagnosis not present

## 2016-01-02 DIAGNOSIS — L820 Inflamed seborrheic keratosis: Secondary | ICD-10-CM | POA: Diagnosis not present

## 2016-01-02 DIAGNOSIS — D485 Neoplasm of uncertain behavior of skin: Secondary | ICD-10-CM | POA: Diagnosis not present

## 2016-01-02 DIAGNOSIS — L821 Other seborrheic keratosis: Secondary | ICD-10-CM | POA: Diagnosis not present

## 2016-01-02 DIAGNOSIS — L570 Actinic keratosis: Secondary | ICD-10-CM | POA: Diagnosis not present

## 2016-01-02 DIAGNOSIS — D2262 Melanocytic nevi of left upper limb, including shoulder: Secondary | ICD-10-CM | POA: Diagnosis not present

## 2016-01-02 DIAGNOSIS — D2271 Melanocytic nevi of right lower limb, including hip: Secondary | ICD-10-CM | POA: Diagnosis not present

## 2016-01-03 ENCOUNTER — Encounter: Payer: Self-pay | Admitting: Internal Medicine

## 2016-01-03 ENCOUNTER — Ambulatory Visit (INDEPENDENT_AMBULATORY_CARE_PROVIDER_SITE_OTHER): Payer: Medicare Other | Admitting: Internal Medicine

## 2016-01-03 VITALS — BP 119/83 | HR 84 | Temp 98.5°F | Ht 73.0 in | Wt 225.0 lb

## 2016-01-03 DIAGNOSIS — K649 Unspecified hemorrhoids: Secondary | ICD-10-CM | POA: Diagnosis not present

## 2016-01-03 DIAGNOSIS — H9221 Otorrhagia, right ear: Secondary | ICD-10-CM

## 2016-01-03 DIAGNOSIS — H9220 Otorrhagia, unspecified ear: Secondary | ICD-10-CM | POA: Insufficient documentation

## 2016-01-03 MED ORDER — HYDROCORTISONE ACE-PRAMOXINE 2.5-1 % RE CREA: 1.0000 "application " | TOPICAL_CREAM | Freq: Three times a day (TID) | RECTAL | Status: AC

## 2016-01-03 NOTE — Assessment & Plan Note (Signed)
Small amount of dried blood in right ear canal. Likely from recent removal of hearing aid. Will continue Ciprodex. Follow up if symptoms not improving.

## 2016-01-03 NOTE — Progress Notes (Signed)
Subjective:    Patient ID: TELL DEMCHAK, male    DOB: 1946-08-05, 70 y.o.   MRN: [id number]  HPI  70YO male presents for follow up.  Hemorrhoids -  About 3.68months ago, developed itching after BM. Then, developed bleeding with bright red blood on stool and tissue paper. Described as small amount. Typically only with first stool of day. No black stool. Internal hemorrhoid noted on colonoscopy 01/2014.  Ear bleeding - Occurred after removing hearing aids last night. Used Ciprodex in past for this with improvement.   Wt Readings from Last 3 Encounters:  01/03/16 225 lb (102.059 kg)  11/01/15 223 lb (101.152 kg)  08/13/15 225 lb (102.059 kg)   BP Readings from Last 3 Encounters:  01/03/16 119/83  11/01/15 136/76  08/13/15 142/84    Past Medical History  Diagnosis Date  . GERD (gastroesophageal reflux disease)   . Hyperlipidemia   . Barrett's esophagus   . Allergic rhinitis   . Colon polyps     hyperplastic  . Hiatal hernia   . Internal hemorrhoids   . Diverticulosis   . Pneumonia    No family history on file. Past Surgical History  Procedure Laterality Date  . Anterior cruciate ligament repair     Social History   Social History  . Marital Status: Married    Spouse Name: N/A  . Number of Children: 2  . Years of Education: N/A   Occupational History  . Retired    Social History Main Topics  . Smoking status: Former Smoker -- 1.00 packs/day for 15 years    Types: Cigarettes    Quit date: 08/25/1981  . Smokeless tobacco: Never Used  . Alcohol Use: 0.0 oz/week    0 Standard drinks or equivalent per week     Comment: daily  . Drug Use: No  . Sexual Activity: Not Asked   Other Topics Concern  . None   Social History Narrative    Review of Systems  Constitutional: Negative for fever, chills, activity change, appetite change, fatigue and unexpected weight change.  HENT: Positive for ear discharge. Negative for ear pain.   Eyes: Negative for visual  disturbance.  Respiratory: Negative for cough and shortness of breath.   Cardiovascular: Negative for chest pain, palpitations and leg swelling.  Gastrointestinal: Positive for blood in stool, anal bleeding and rectal pain. Negative for nausea, vomiting, abdominal pain, diarrhea, constipation and abdominal distention.  Genitourinary: Negative for dysuria, urgency and difficulty urinating.  Musculoskeletal: Negative for arthralgias and gait problem.  Skin: Negative for color change and rash.  Hematological: Negative for adenopathy.  Psychiatric/Behavioral: Negative for sleep disturbance and dysphoric mood. The patient is not nervous/anxious.        Objective:    BP 119/83 mmHg  Pulse 84  Temp(Src) 98.5 F (36.9 C) (Oral)  Ht 6\' 1"  (1.854 m)  Wt 225 lb (102.059 kg)  BMI 29.69 kg/m2  SpO2 95% Physical Exam  Constitutional: He is oriented to person, place, and time. He appears well-developed and well-nourished. No distress.  HENT:  Head: Normocephalic and atraumatic.  Right Ear: External ear normal.  Left Ear: External ear normal.  Nose: Nose normal.  Mouth/Throat: Oropharynx is clear and moist. No oropharyngeal exudate.  Small amount dried blood in inferior right ear canal  Eyes: Conjunctivae and EOM are normal. Pupils are equal, round, and reactive to light. Right eye exhibits no discharge. Left eye exhibits no discharge. No scleral icterus.  Neck: Normal range of motion.  Neck supple. No tracheal deviation present. No thyromegaly present.  Cardiovascular: Normal rate, regular rhythm and normal heart sounds.  Exam reveals no gallop and no friction rub.   No murmur heard. Pulmonary/Chest: Effort normal and breath sounds normal. No accessory muscle usage. No tachypnea. No respiratory distress. He has no decreased breath sounds. He has no wheezes. He has no rhonchi. He has no rales. He exhibits no tenderness.  Genitourinary: Rectal exam shows external hemorrhoid. Rectal exam shows no  fissure, no mass, no tenderness and anal tone normal.     Musculoskeletal: Normal range of motion. He exhibits no edema.  Lymphadenopathy:    He has no cervical adenopathy.  Neurological: He is alert and oriented to person, place, and time. No cranial nerve deficit. Coordination normal.  Skin: Skin is warm and dry. No rash noted. He is not diaphoretic. No erythema. No pallor.  Psychiatric: He has a normal mood and affect. His behavior is normal. Judgment and thought content normal.          Assessment & Plan:   Problem List Items Addressed This Visit      Unprioritized   Blood in ear canal    Small amount of dried blood in right ear canal. Likely from recent removal of hearing aid. Will continue Ciprodex. Follow up if symptoms not improving.      Hemorrhoid - Primary    Exam consistent with external hemorrhoid. Will start topical Analpram. Follow up if symptoms not improving. Discussed some potential interventions if no improvement.          Return if symptoms worsen or fail to improve.

## 2016-01-03 NOTE — Assessment & Plan Note (Signed)
Exam consistent with external hemorrhoid. Will start topical Analpram. Follow up if symptoms not improving. Discussed some potential interventions if no improvement.

## 2016-01-03 NOTE — Progress Notes (Signed)
Pre visit review using our clinic review tool, if applicable. No additional management support is needed unless otherwise documented below in the visit note. 

## 2016-01-03 NOTE — Patient Instructions (Signed)
Start Analpram. Use pre-moistened toilet paper and Tucks pads as needed. Follow up if symptoms are not improving.   Hemorrhoids Hemorrhoids are swollen veins around the rectum or anus. There are two types of hemorrhoids:   Internal hemorrhoids. These occur in the veins just inside the rectum. They may poke through to the outside and become irritated and painful.  External hemorrhoids. These occur in the veins outside the anus and can be felt as a painful swelling or hard lump near the anus. CAUSES  Pregnancy.   Obesity.   Constipation or diarrhea.   Straining to have a bowel movement.   Sitting for long periods on the toilet.  Heavy lifting or other activity that caused you to strain.  Anal intercourse. SYMPTOMS   Pain.   Anal itching or irritation.   Rectal bleeding.   Fecal leakage.   Anal swelling.   One or more lumps around the anus.  DIAGNOSIS  Your caregiver may be able to diagnose hemorrhoids by visual examination. Other examinations or tests that may be performed include:   Examination of the rectal area with a gloved hand (digital rectal exam).   Examination of anal canal using a small tube (scope).   A blood test if you have lost a significant amount of blood.  A test to look inside the colon (sigmoidoscopy or colonoscopy). TREATMENT Most hemorrhoids can be treated at home. However, if symptoms do not seem to be getting better or if you have a lot of rectal bleeding, your caregiver may perform a procedure to help make the hemorrhoids get smaller or remove them completely. Possible treatments include:   Placing a rubber band at the base of the hemorrhoid to cut off the circulation (rubber band ligation).   Injecting a chemical to shrink the hemorrhoid (sclerotherapy).   Using a tool to burn the hemorrhoid (infrared light therapy).   Surgically removing the hemorrhoid (hemorrhoidectomy).   Stapling the hemorrhoid to block blood flow to  the tissue (hemorrhoid stapling).  HOME CARE INSTRUCTIONS   Eat foods with fiber, such as whole grains, beans, nuts, fruits, and vegetables. Ask your doctor about taking products with added fiber in them (fibersupplements).  Increase fluid intake. Drink enough water and fluids to keep your urine clear or pale yellow.   Exercise regularly.   Go to the bathroom when you have the urge to have a bowel movement. Do not wait.   Avoid straining to have bowel movements.   Keep the anal area dry and clean. Use wet toilet paper or moist towelettes after a bowel movement.   Medicated creams and suppositories may be used or applied as directed.   Only take over-the-counter or prescription medicines as directed by your caregiver.   Take warm sitz baths for 15-20 minutes, 3-4 times a day to ease pain and discomfort.   Place ice packs on the hemorrhoids if they are tender and swollen. Using ice packs between sitz baths may be helpful.   Put ice in a plastic bag.   Place a towel between your skin and the bag.   Leave the ice on for 15-20 minutes, 3-4 times a day.   Do not use a donut-shaped pillow or sit on the toilet for long periods. This increases blood pooling and pain.  SEEK MEDICAL CARE IF:  You have increasing pain and swelling that is not controlled by treatment or medicine.  You have uncontrolled bleeding.  You have difficulty or you are unable to have a bowel  movement.  You have pain or inflammation outside the area of the hemorrhoids. MAKE SURE YOU:  Understand these instructions.  Will watch your condition.  Will get help right away if you are not doing well or get worse.   This information is not intended to replace advice given to you by your health care provider. Make sure you discuss any questions you have with your health care provider.   Document Released: 11/14/2000 Document Revised: 11/03/2012 Document Reviewed: 09/21/2012 Elsevier Interactive  Patient Education [employer].

## 2016-01-10 ENCOUNTER — Ambulatory Visit (INDEPENDENT_AMBULATORY_CARE_PROVIDER_SITE_OTHER): Payer: Medicare Other | Admitting: Internal Medicine

## 2016-01-10 ENCOUNTER — Ambulatory Visit: Payer: Self-pay | Admitting: Pulmonary Disease

## 2016-01-10 DIAGNOSIS — H6123 Impacted cerumen, bilateral: Secondary | ICD-10-CM

## 2016-01-10 NOTE — Progress Notes (Signed)
Patient came in for cerumen impaction.  Inspected left ear , noted a area of thick orange colored wax.  Irrigated with peroxide and warm water.  Patient tolerated well, removed orange thick wax, clear visualization of ear canal.  Discussed with patient that he has an area of black scab that is noted in the ear canal, he states that his old hearing aide made that and it was fine.  It is not actively bleeding, just a dark black scab.  Inspected right ear, noted the same type of black scab and a small amount of tan wax noted.  Irrigated with peroxide and warm water and ear canal visualized clearly.  Patient was happy with results.  He is getting his new hearing aides tomorrow.  Please advise.

## 2016-01-14 NOTE — Progress Notes (Signed)
Agree with plan 

## 2016-01-30 IMAGING — CR DG CHEST 2V
2 series · 2 of 2 positions shown · non-contrast
Comparison: PA and lateral chest x-ray April 16, 2015

CLINICAL DATA: Clinical acute bronchitis, remote history of tobacco
use.

EXAM:
CHEST  2 VIEW

[view not recorded (1 of 2)]
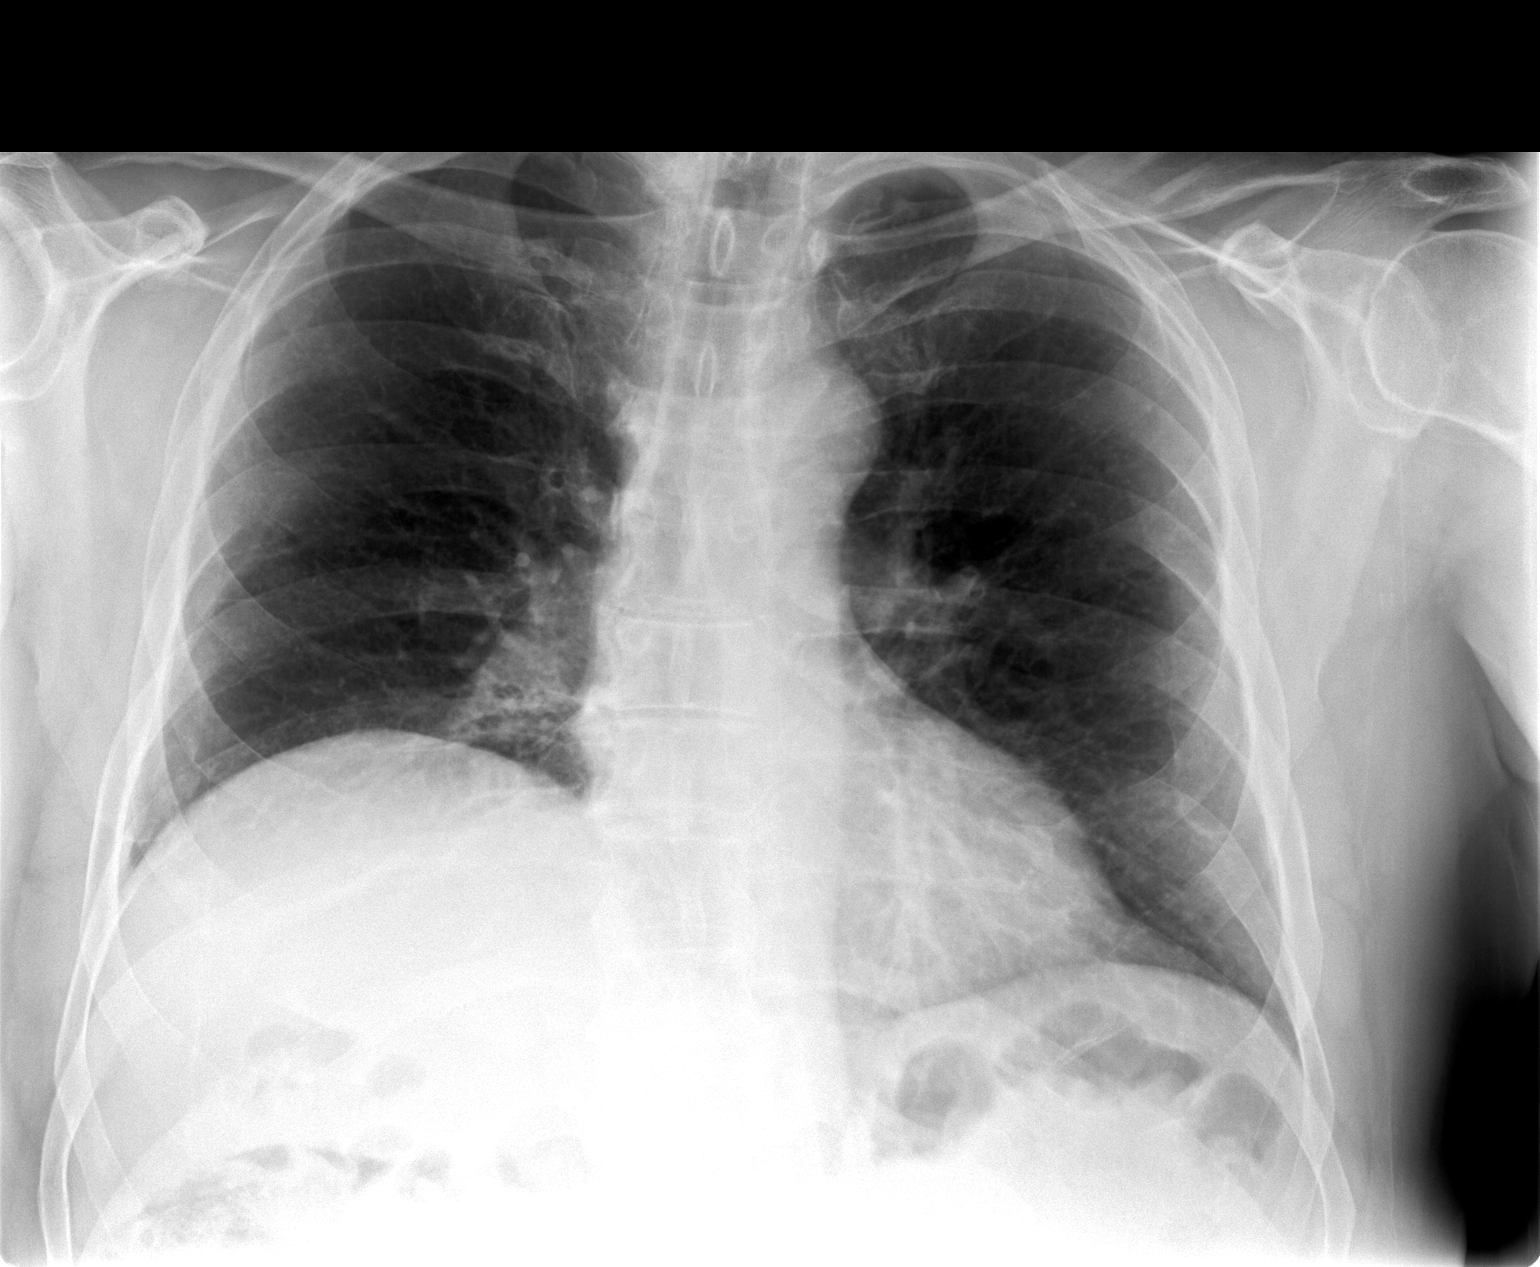

[view not recorded (2 of 2)]
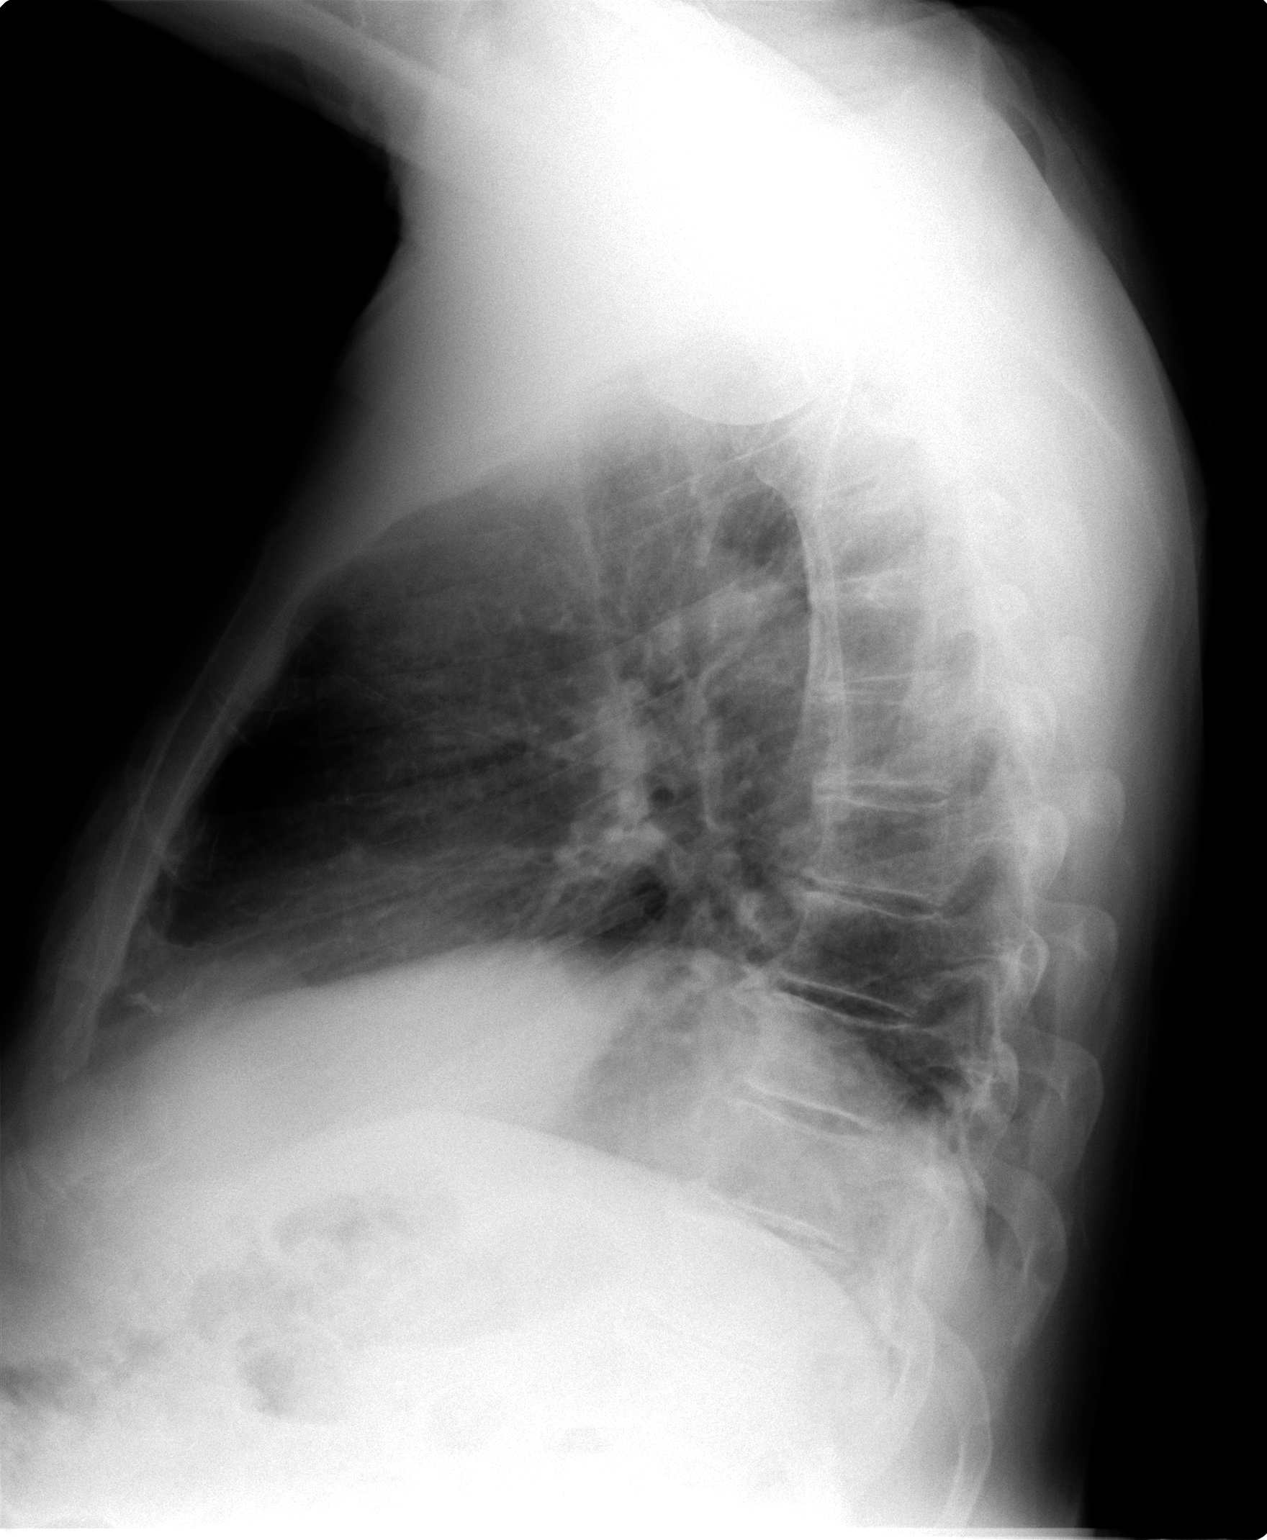

[2 of 2 positions shown; findings below may reference images not displayed]

FINDINGS: There is chronic mild elevation of the right hemidiaphragm. The
lungs are adequately inflated. There is no focal infiltrate. The
heart and pulmonary vascularity are normal. The mediastinum is
normal in width. The bony thorax exhibits no acute abnormality.
IMPRESSION: There is no active cardiopulmonary disease.

## 2016-04-17 ENCOUNTER — Telehealth: Payer: Self-pay | Admitting: Cardiology

## 2016-04-17 ENCOUNTER — Telehealth: Payer: Self-pay | Admitting: Cardiovascular Disease

## 2016-04-17 NOTE — Telephone Encounter (Signed)
Pt c/o of Chest Pain: STAT if CP now or developed within 24 hours  1. Are you having CP right now? No   2. Are you experiencing any other symptoms (ex. SOB, nausea, vomiting, sweating)? No   3. How long have you been experiencing CP? Couple of months   4. Is your CP continuous or coming and going? Comes and goes   5. Have you taken Nitroglycerin? No  Has hx of seeing dr. Fletcher Anon in 2014 wants to be seen sooner than July which is next available schedule next available patient advised to seek emergency care if needed to call 911 or go to er  ?

## 2016-04-24 ENCOUNTER — Ambulatory Visit (INDEPENDENT_AMBULATORY_CARE_PROVIDER_SITE_OTHER): Payer: Medicare Other | Admitting: Cardiology

## 2016-04-24 ENCOUNTER — Encounter: Payer: Self-pay | Admitting: Cardiology

## 2016-04-24 VITALS — BP 140/76 | HR 73 | Ht 73.0 in | Wt 231.0 lb

## 2016-04-24 DIAGNOSIS — R079 Chest pain, unspecified: Secondary | ICD-10-CM

## 2016-04-24 NOTE — Patient Instructions (Addendum)
Medication Instructions:  Your physician recommends that you continue on your current medications as directed. Please refer to the Current Medication list given to you today.   Labwork: None ordered  Testing/Procedures: Your physician has requested that you have an echocardiogram. Echocardiography is a painless test that uses sound waves to create images of your heart. It provides your doctor with information about the size and shape of your heart and how well your heart's chambers and valves are working. This procedure takes approximately one hour. There are no restrictions for this procedure.  Date & Time: _________________________________________________________  [facility]  Your caregiver has ordered a Stress Test with nuclear imaging. The purpose of this test is to evaluate the blood supply to your heart muscle. Cardiac stress tests are done to find areas of poor blood flow to the heart by determining the extent of coronary artery disease (CAD).    Please note: these test may take anywhere between 2-4 hours to complete  PLEASE REPORT TO [place] AT THE FIRST DESK WILL DIRECT YOU WHERE TO GO  Date of Procedure:___Tuesday May 06, 2016 at 07:30AM__________  Arrival Time for Procedure:___Arrive at 07:15AM to register___   PLEASE NOTIFY THE OFFICE AT LEAST 24 HOURS IN ADVANCE IF YOU ARE UNABLE TO [place].  [phone] AND  PLEASE NOTIFY NUCLEAR MEDICINE AT [facility] AT LEAST 24 HOURS IN ADVANCE IF YOU ARE UNABLE TO KEEP YOUR APPOINTMENT. [phone]  How to prepare for your Myoview test:   Do not eat or drink after midnight  No caffeine for 24 hours prior to test  No smoking 24 hours prior to test.  Your medication may be taken with water.  If your doctor stopped a medication because of this test, do not take that medication.  Ladies, please do not wear dresses.  Skirts or pants are appropriate. Please wear a short sleeve  shirt.  No perfume, cologne or lotion.  Wear comfortable walking shoes. No heels!    Follow-Up: Your physician recommends that you schedule a follow-up appointment as needed. We will call you with results and schedule follow up appointment at that time if needed.     Any Other Special Instructions Will Be Listed Below (If Applicable).     If you need a refill on your cardiac medications before your next appointment, please call your pharmacy.  Cardiac Nuclear Scanning A cardiac nuclear scan is used to check your heart for problems, such as the following:  A portion of the heart is not getting enough blood.  Part of the heart muscle has died, which happens with a heart attack.  The heart wall is not working normally.  In this test, a radioactive dye (tracer) is injected into your bloodstream. After the tracer has traveled to your heart, a scanning device is used to measure how much of the tracer is absorbed by or distributed to various areas of your heart. LET [facility] CARE PROVIDER KNOW ABOUT:  Any allergies you have.  All medicines you are taking, including vitamins, herbs, eye drops, creams, and over-the-counter medicines.  Previous problems you or members of your family have had with the use of anesthetics.  Any blood disorders you have.  Previous surgeries you have had.  Medical conditions you have.  RISKS AND COMPLICATIONS Generally, this is a safe procedure. However, as with any procedure, problems can occur. Possible problems include:   Serious chest pain.  Rapid heartbeat.  Sensation of warmth in your  chest. This usually passes quickly. BEFORE THE PROCEDURE Ask your health care provider about changing or stopping your regular medicines. PROCEDURE This procedure is usually done at a hospital and takes 2-4 hours.  An IV tube is inserted into one of your veins.  Your health care provider will inject a small amount of radioactive tracer through the  tube.  You will then wait for 20-40 minutes while the tracer travels through your bloodstream.  You will lie down on an exam table so images of your heart can be taken. Images will be taken for about 15-20 minutes.  You will exercise on a treadmill or stationary bike. While you exercise, your heart activity will be monitored with an electrocardiogram (ECG), and your blood pressure will be checked.  If you are unable to exercise, you may be given a medicine to make your heart beat faster.  When blood flow to your heart has peaked, tracer will again be injected through the IV tube.  After 20-40 minutes, you will get back on the exam table and have more images taken of your heart.  When the procedure is over, your IV tube will be removed. AFTER THE PROCEDURE  You will likely be able to leave shortly after the test. Unless your health care provider tells you otherwise, you may return to your normal schedule, including diet, activities, and medicines.  Make sure you find out how and when you will get your test results.   This information is not intended to replace advice given to you by your health care provider. Make sure you discuss any questions you have with your health care provider.   Document Released: 12/12/2004 Document Revised: 11/22/2013 Document Reviewed: 10/26/2013 Elsevier Interactive Patient Education [employer].   Echocardiogram An echocardiogram, or echocardiography, uses sound waves (ultrasound) to produce an image of your heart. The echocardiogram is simple, painless, obtained within a short period of time, and offers valuable information to your health care provider. The images from an echocardiogram can provide information such as:  Evidence of coronary artery disease (CAD).  Heart size.  Heart muscle function.  Heart valve function.  Aneurysm detection.  Evidence of a past heart attack.  Fluid buildup around the heart.  Heart muscle  thickening.  Assess heart valve function. LET [facility] CARE PROVIDER KNOW ABOUT:  Any allergies you have.  All medicines you are taking, including vitamins, herbs, eye drops, creams, and over-the-counter medicines.  Previous problems you or members of your family have had with the use of anesthetics.  Any blood disorders you have.  Previous surgeries you have had.  Medical conditions you have.  Possibility of pregnancy, if this applies. BEFORE THE PROCEDURE  No special preparation is needed. Eat and drink normally.  PROCEDURE   In order to produce an image of your heart, gel will be applied to your chest and a wand-like tool (transducer) will be moved over your chest. The gel will help transmit the sound waves from the transducer. The sound waves will harmlessly bounce off your heart to allow the heart images to be captured in real-time motion. These images will then be recorded.  You may need an IV to receive a medicine that improves the quality of the pictures. AFTER THE PROCEDURE You may return to your normal schedule including diet, activities, and medicines, unless your health care provider tells you otherwise.   This information is not intended to replace advice given to you by your health care provider. Make sure  you discuss any questions you have with your health care provider.   Document Released: 11/14/2000 Document Revised: 12/08/2014 Document Reviewed: 07/25/2013 Elsevier Interactive Patient Education [employer].

## 2016-04-24 NOTE — Progress Notes (Signed)
Cardiology Office Note   Date:  04/24/2016   ID:  Christin, Jones 04-11-1946, MRN [id number]  Referring Doctor:  Rica Mast, MD   Cardiologist:   Wende Bushy, MD   Reason for consultation:  Chief Complaint  Patient presents with  . other    Patient c/o chest pain, has seen Dr. Fletcher Anon in 2013. Meds reviewed by the verbally.       History of Present Illness: Richard Smith is a 70 y.o. male who presents forChest pain. This has been going on for 3-4 months now. It's in the center of the chest, sometimes noted to be on the right upper side of the chest. Sharp in nature. Lasting at the most 30 seconds at a time. 4 out of 10 severity. Not really related to exertion. Not related to movements of the left arm or left shoulder. No shortness of breath.  Patient reports that he is physically active and plays tennis and soccer. No chest pains or shortness of breath with those activities.  No fever, cough, colds, abdominal pain. No PND, orthopnea, edema.   ROS:  Please see the history of present illness. Aside from mentioned under HPI, all other systems are reviewed and negative.     Past Medical History  Diagnosis Date  . GERD (gastroesophageal reflux disease)   . Hyperlipidemia   . Barrett's esophagus   . Allergic rhinitis   . Colon polyps     hyperplastic  . Hiatal hernia   . Internal hemorrhoids   . Diverticulosis   . Pneumonia     Past Surgical History  Procedure Laterality Date  . Anterior cruciate ligament repair       reports that he quit smoking about 34 years ago. His smoking use included Cigarettes. He has a 15 pack-year smoking history. He has never used smokeless tobacco. He reports that he drinks alcohol. He reports that he does not use illicit drugs.   Family history is unknown by patient.   Current Outpatient Prescriptions  Medication Sig Dispense Refill  . celecoxib (CELEBREX) 200 MG capsule TAKE 1 CAPSULE (200 MG TOTAL) BY MOUTH DAILY.  30 capsule 2  . cetirizine (ZYRTEC) 10 MG tablet Take 10 mg by mouth daily.    . clotrimazole-betamethasone (LOTRISONE) cream Apply topically 2 (two) times daily. 45 g 3  . DEXILANT 60 MG capsule TAKE 1 CAPSULE (60 MG TOTAL) BY MOUTH DAILY. 30 capsule 6  . hydrocortisone-pramoxine (ANALPRAM-HC) 2.5-1 % rectal cream Place 1 application rectally 3 (three) times daily. 30 g 0  . metoCLOPramide (REGLAN) 10 MG tablet TAKE 1 TABLET BY MOUTH 3 TIMES DAILY AS NEEDED 90 tablet 0  . Omega-3 Fatty Acids (FISH OIL) 500 MG CAPS Take by mouth daily.    Marland Kitchen triamcinolone (NASACORT) 55 MCG/ACT AERO nasal inhaler PLACE 2 SPRAYS INTO THE NOSE DAILY. 1 Inhaler 2  . triamcinolone cream (KENALOG) 0.1 % Apply 1 application topically 2 (two) times daily. (Patient taking differently: Apply 1 application topically 2 (two) times daily as needed. ) 30 g 3   No current facility-administered medications for this visit.    Allergies: Lactose intolerance (gi)    PHYSICAL EXAM: VS:  BP 140/76 mmHg  Pulse 73  Ht 6\' 1"  (1.854 m)  Wt 231 lb (104.781 kg)  BMI 30.48 kg/m2  SpO2 97% , Body mass index is 30.48 kg/(m^2). Wt Readings from Last 3 Encounters:  04/24/16 231 lb (104.781 kg)  01/03/16 225 lb (102.059 kg)  11/01/15 223 lb (101.152 kg)    GENERAL:  well developed, well nourished, obese, not in acute distress HEENT: normocephalic, pink conjunctivae, anicteric sclerae, no xanthelasma, normal dentition, oropharynx clear NECK:  no neck vein engorgement, JVP normal, no hepatojugular reflux, carotid upstroke brisk and symmetric, no bruit, no thyromegaly, no lymphadenopathy LUNGS:  good respiratory effort, clear to auscultation bilaterally CV:  PMI not displaced, no thrills, no lifts, S1 and S2 within normal limits, no palpable S3 or S4, no murmurs, no rubs, no gallops ABD:  Soft, nontender, nondistended, normoactive bowel sounds, no abdominal aortic bruit, no hepatomegaly, no splenomegaly MS: nontender back, no kyphosis,  no scoliosis, no joint deformities EXT:  2+ DP/PT pulses, no edema, no varicosities, no cyanosis, no clubbing SKIN: warm, nondiaphoretic, normal turgor, no ulcers NEUROPSYCH: alert, oriented to person, place, and time, sensory/motor grossly intact, normal mood, appropriate affect  Recent Labs: No results found for requested labs within last 365 days.   Lipid Panel    Component Value Date/Time   CHOL 247* 11/07/2014 1641   TRIG 262.0* 11/07/2014 1641   HDL 45.80 11/07/2014 1641   CHOLHDL 5 11/07/2014 1641   VLDL 52.4* 11/07/2014 1641   LDLDIRECT 174.2 11/07/2014 1641     Other studies Reviewed:  EKG:  The ekg from 04/24/2016 was personally reviewed by me and it revealed sinus rhythm, 73 BPM, right superior axis deviation.  Additional studies/ records that were reviewed personally reviewed by me today include: None available   ASSESSMENT AND PLAN:  Chest pain Atypical in nature. Patient just was make sure it is not his heart. For patient reassurance, recommend exercise nuclear stress test. Recommend echocardiogram as well.  If stress test turns out to be normal, may consider calcium score for further risk stratification. If he ends up with a diagnosis of atherosclerosis, this may change the management she meant in terms of initiating aspirin, and changing LDL goals.  Current medicines are reviewed at length with the patient today.  The patient does not have concerns regarding medicines.  Labs/ tests ordered today include:  Orders Placed This Encounter  Procedures  . NM Myocar Multi W/Spect W/Wall Motion / EF  . EKG 12-Lead  . ECHOCARDIOGRAM COMPLETE    I had a lengthy and detailed discussion with the patient regarding diagnoses, prognosis, diagnostic options, treatment options.   I counseled the patient on importance of lifestyle modification including heart healthy diet, regular physical activity.   Disposition:   FU with undersigned after tests When necessary  I  spent at least 45 minutes with the patient today and more than 50% of the time was spent counseling the patient and coordinating care.      Signed, Wende Bushy, MD  04/24/2016 4:13 PM    [facility]

## 2016-05-02 ENCOUNTER — Telehealth: Payer: Self-pay | Admitting: Cardiology

## 2016-05-02 NOTE — Telephone Encounter (Signed)
Confirmed upcoming stress test on Tuesday 05/06/16 at 07:30AM. Patient verbalized understanding of date, time, location, and instructions with no further questions at this time.

## 2016-05-06 ENCOUNTER — Ambulatory Visit
Admission: RE | Admit: 2016-05-06 | Discharge: 2016-05-06 | Disposition: A | Discharge status: Home / Self Care | Payer: Medicare Other | Source: Ambulatory Visit | Attending: Cardiology | Admitting: Cardiology

## 2016-05-06 DIAGNOSIS — R079 Chest pain, unspecified: Secondary | ICD-10-CM | POA: Insufficient documentation

## 2016-05-06 DIAGNOSIS — R931 Abnormal findings on diagnostic imaging of heart and coronary circulation: Secondary | ICD-10-CM | POA: Diagnosis not present

## 2016-05-06 MED ORDER — TECHNETIUM TC 99M TETROFOSMIN IV KIT
13.3800 | PACK | Freq: Once | INTRAVENOUS | Status: AC | PRN
  Administered 2016-05-06: 13.38 via INTRAVENOUS

## 2016-05-06 MED ORDER — TECHNETIUM TC 99M TETROFOSMIN IV KIT
30.0000 | PACK | Freq: Once | INTRAVENOUS | Status: AC | PRN
  Administered 2016-05-06: 32.702 via INTRAVENOUS

## 2016-05-07 ENCOUNTER — Telehealth: Payer: Self-pay | Admitting: Cardiology

## 2016-05-07 NOTE — Telephone Encounter (Signed)
Patient wants stress test results please call.

## 2016-05-07 NOTE — Telephone Encounter (Signed)
Spoke with patient and let him know that Dr. Yvone Neu would see him after his echocardiogram on Friday to discuss results with him. He verbalized understanding and had no further questions at this time.

## 2016-05-08 LAB — NM MYOCAR MULTI W/SPECT W/WALL MOTION / EF
CHL CUP NUCLEAR SRS: 0
CSEPPHR: 127 {beats}/min
Estimated workload: 12.7 METS
Exercise duration (min): 10 min
Exercise duration (sec): 28 s
LV dias vol: 81 mL (ref 62–150)
LV sys vol: 22 mL
Percent HR: 84 %
Rest HR: 66 {beats}/min
SDS: 0
SSS: 0
TID: 1

## 2016-05-09 ENCOUNTER — Ambulatory Visit (INDEPENDENT_AMBULATORY_CARE_PROVIDER_SITE_OTHER): Payer: Medicare Other

## 2016-05-09 ENCOUNTER — Other Ambulatory Visit: Payer: Self-pay | Admitting: *Deleted

## 2016-05-09 ENCOUNTER — Encounter: Payer: Self-pay | Admitting: Cardiology

## 2016-05-09 ENCOUNTER — Other Ambulatory Visit: Payer: Self-pay

## 2016-05-09 DIAGNOSIS — R079 Chest pain, unspecified: Secondary | ICD-10-CM | POA: Diagnosis not present

## 2016-05-09 LAB — ECHOCARDIOGRAM COMPLETE
AOASC: 33 cm
E decel time: 254 msec
E/e' ratio: 5.6
FS: 30 % (ref 28–44)
IV/PV OW: 1.07
LA diam end sys: 42 mm
LA diam index: 1.79 cm/m2
LA vol A4C: 41.7 ml
LA vol index: 23.2 mL/m2
LASIZE: 42 mm
LAVOL: 54.5 mL
LV E/e' medial: 5.6
LV e' LATERAL: 10.7 cm/s
LVEEAVG: 5.6
LVOT VTI: 20.4 cm
LVOT area: 4.52 cm2
LVOT diameter: 24 mm
LVOT peak vel: 96.4 cm/s
LVOTSV: 92 mL
MV Dec: 254
MVPKAVEL: 50.3 m/s
MVPKEVEL: 59.9 m/s
PW: 12 mm — AB (ref 0.6–1.1)
RV TAPSE: 24 mm
TDI e' lateral: 10.7
TDI e' medial: 5.66

## 2016-05-09 MED ORDER — NITROGLYCERIN 0.4 MG SL SUBL: 0.4000 mg | SUBLINGUAL_TABLET | SUBLINGUAL | Status: AC | PRN

## 2016-05-09 NOTE — Progress Notes (Signed)
Dr. Yvone Neu discussed with patient abnormal stress test results and recommended for patient to move forward and have a coronary CTA done. Patient is moving out of state to [place] and provided him with information about testing for him to have done once he gets to his new home. He verbalized that he would make sure to follow through to get that done at his new location as soon as possible.

## 2016-06-19 ENCOUNTER — Encounter: Payer: Self-pay | Admitting: Internal Medicine

## 2016-07-04 ENCOUNTER — Telehealth: Payer: Self-pay | Admitting: Internal Medicine

## 2016-07-04 NOTE — Telephone Encounter (Signed)
[facility] [phone] called regarding pt needing a refill for celecoxib (CELEBREX) 200 MG capsule.  Thank you!

## 2016-07-07 MED ORDER — CELECOXIB 200 MG PO CAPS
200.0000 mg | ORAL_CAPSULE | Freq: Every day | ORAL | 0 refills | Status: AC
Start: 2016-07-07 — End: ?

## 2016-07-07 NOTE — Telephone Encounter (Signed)
Please see medication request for one on Dr Thomes Dinning pt... Per phone note in chart he has moved to [place].  Last OV 01/2016... Okay to refill?

## 2016-07-07 NOTE — Telephone Encounter (Signed)
Notified pt. 

## 2016-07-07 NOTE — Telephone Encounter (Signed)
30 day refill authorization,  After that all Medication refills will be  denied,  If he has moved to [place] he needs to establish care with a provider there

## 2016-07-07 NOTE — Telephone Encounter (Signed)
Pt is requesting refills on his Dexilant. He has moved out of state and not established care with a new doctor yet. Can we supply a short term supply? Last office visit 06-01-2015. Pt has Barretts esophagus. Please advise. Thank you.

## 2016-07-14 MED ORDER — DEXLANSOPRAZOLE 60 MG PO CPDR: 60.0000 mg | DELAYED_RELEASE_CAPSULE | Freq: Every day | ORAL | 2 refills | Status: AC

## 2016-07-14 NOTE — Telephone Encounter (Signed)
Yes, refill for 3 months. That should give him adequate time to find a new GI doctor. No refills thereafter. Thank you

## 2016-07-14 NOTE — Telephone Encounter (Signed)
Rx refilled as directed. 

## 2016-12-12 NOTE — Telephone Encounter (Signed)
error 

## 2016-12-18 DIAGNOSIS — J069 Acute upper respiratory infection, unspecified: Secondary | ICD-10-CM | POA: Diagnosis not present

## 2017-02-12 DIAGNOSIS — D2272 Melanocytic nevi of left lower limb, including hip: Secondary | ICD-10-CM | POA: Diagnosis not present

## 2017-02-12 DIAGNOSIS — L570 Actinic keratosis: Secondary | ICD-10-CM | POA: Diagnosis not present

## 2017-02-12 DIAGNOSIS — L820 Inflamed seborrheic keratosis: Secondary | ICD-10-CM | POA: Diagnosis not present

## 2017-02-12 DIAGNOSIS — X32XXXA Exposure to sunlight, initial encounter: Secondary | ICD-10-CM | POA: Diagnosis not present

## 2017-02-12 DIAGNOSIS — D2261 Melanocytic nevi of right upper limb, including shoulder: Secondary | ICD-10-CM | POA: Diagnosis not present

## 2017-02-12 DIAGNOSIS — D225 Melanocytic nevi of trunk: Secondary | ICD-10-CM | POA: Diagnosis not present

## 2017-02-19 DIAGNOSIS — R112 Nausea with vomiting, unspecified: Secondary | ICD-10-CM | POA: Diagnosis not present

## 2017-02-19 DIAGNOSIS — E785 Hyperlipidemia, unspecified: Secondary | ICD-10-CM | POA: Diagnosis not present

## 2017-02-19 DIAGNOSIS — J029 Acute pharyngitis, unspecified: Secondary | ICD-10-CM | POA: Diagnosis not present

## 2017-02-19 DIAGNOSIS — Z79899 Other long term (current) drug therapy: Secondary | ICD-10-CM | POA: Diagnosis not present

## 2017-02-19 DIAGNOSIS — K219 Gastro-esophageal reflux disease without esophagitis: Secondary | ICD-10-CM | POA: Diagnosis not present

## 2017-02-19 DIAGNOSIS — B349 Viral infection, unspecified: Secondary | ICD-10-CM | POA: Diagnosis not present

## 2017-02-19 DIAGNOSIS — Z87891 Personal history of nicotine dependence: Secondary | ICD-10-CM | POA: Diagnosis not present

## 2017-02-26 DIAGNOSIS — K644 Residual hemorrhoidal skin tags: Secondary | ICD-10-CM | POA: Diagnosis not present

## 2017-02-26 DIAGNOSIS — B356 Tinea cruris: Secondary | ICD-10-CM | POA: Diagnosis not present

## 2017-02-26 DIAGNOSIS — R7989 Other specified abnormal findings of blood chemistry: Secondary | ICD-10-CM | POA: Diagnosis not present

## 2017-02-26 DIAGNOSIS — K2900 Acute gastritis without bleeding: Secondary | ICD-10-CM | POA: Diagnosis not present

## 2017-03-04 DIAGNOSIS — R7301 Impaired fasting glucose: Secondary | ICD-10-CM | POA: Diagnosis not present

## 2017-03-10 DIAGNOSIS — R932 Abnormal findings on diagnostic imaging of liver and biliary tract: Secondary | ICD-10-CM | POA: Diagnosis not present

## 2017-03-10 DIAGNOSIS — R748 Abnormal levels of other serum enzymes: Secondary | ICD-10-CM | POA: Diagnosis not present

## 2017-03-25 DIAGNOSIS — K602 Anal fissure, unspecified: Secondary | ICD-10-CM | POA: Diagnosis not present

## 2017-04-21 DIAGNOSIS — R748 Abnormal levels of other serum enzymes: Secondary | ICD-10-CM | POA: Diagnosis not present

## 2017-04-21 DIAGNOSIS — K219 Gastro-esophageal reflux disease without esophagitis: Secondary | ICD-10-CM | POA: Diagnosis not present

## 2017-04-21 DIAGNOSIS — K602 Anal fissure, unspecified: Secondary | ICD-10-CM | POA: Diagnosis not present

## 2017-04-21 DIAGNOSIS — R932 Abnormal findings on diagnostic imaging of liver and biliary tract: Secondary | ICD-10-CM | POA: Diagnosis not present

## 2017-04-21 DIAGNOSIS — Z1159 Encounter for screening for other viral diseases: Secondary | ICD-10-CM | POA: Diagnosis not present

## 2017-04-29 DIAGNOSIS — K602 Anal fissure, unspecified: Secondary | ICD-10-CM | POA: Diagnosis not present

## 2017-08-06 DIAGNOSIS — K219 Gastro-esophageal reflux disease without esophagitis: Secondary | ICD-10-CM | POA: Diagnosis not present

## 2017-08-06 DIAGNOSIS — Z8639 Personal history of other endocrine, nutritional and metabolic disease: Secondary | ICD-10-CM | POA: Diagnosis not present

## 2017-08-06 DIAGNOSIS — R7989 Other specified abnormal findings of blood chemistry: Secondary | ICD-10-CM | POA: Diagnosis not present

## 2017-08-25 DIAGNOSIS — R7301 Impaired fasting glucose: Secondary | ICD-10-CM | POA: Diagnosis not present

## 2017-08-25 DIAGNOSIS — Z8639 Personal history of other endocrine, nutritional and metabolic disease: Secondary | ICD-10-CM | POA: Diagnosis not present

## 2017-08-25 DIAGNOSIS — R7989 Other specified abnormal findings of blood chemistry: Secondary | ICD-10-CM | POA: Diagnosis not present

## 2017-08-25 DIAGNOSIS — Z23 Encounter for immunization: Secondary | ICD-10-CM | POA: Diagnosis not present

## 2017-11-09 DIAGNOSIS — H52203 Unspecified astigmatism, bilateral: Secondary | ICD-10-CM | POA: Diagnosis not present

## 2017-11-09 DIAGNOSIS — H04123 Dry eye syndrome of bilateral lacrimal glands: Secondary | ICD-10-CM | POA: Diagnosis not present

## 2017-11-09 DIAGNOSIS — H26491 Other secondary cataract, right eye: Secondary | ICD-10-CM | POA: Diagnosis not present

## 2017-11-09 DIAGNOSIS — Z961 Presence of intraocular lens: Secondary | ICD-10-CM | POA: Diagnosis not present
